# Patient Record
Sex: Female | Born: 1985 | Race: White | Hispanic: No | Marital: Married
Health system: Southern US, Community
[De-identification: ages and names within clinical notes are randomized; demographics above are authoritative.]

## PROBLEM LIST (undated history)

## (undated) DIAGNOSIS — Q631 Lobulated, fused and horseshoe kidney: Secondary | ICD-10-CM

## (undated) DIAGNOSIS — S7400XA Injury of sciatic nerve at hip and thigh level, unspecified leg, initial encounter: Secondary | ICD-10-CM

## (undated) DIAGNOSIS — F3181 Bipolar II disorder: Secondary | ICD-10-CM

## (undated) DIAGNOSIS — M549 Dorsalgia, unspecified: Secondary | ICD-10-CM

## (undated) DIAGNOSIS — G8929 Other chronic pain: Secondary | ICD-10-CM

## (undated) DIAGNOSIS — F410 Panic disorder [episodic paroxysmal anxiety] without agoraphobia: Secondary | ICD-10-CM

## (undated) DIAGNOSIS — J4520 Mild intermittent asthma, uncomplicated: Secondary | ICD-10-CM

## (undated) DIAGNOSIS — N201 Calculus of ureter: Secondary | ICD-10-CM

## (undated) DIAGNOSIS — L309 Dermatitis, unspecified: Secondary | ICD-10-CM

## (undated) DIAGNOSIS — F488 Other specified nonpsychotic mental disorders: Secondary | ICD-10-CM

## (undated) DIAGNOSIS — E538 Deficiency of other specified B group vitamins: Secondary | ICD-10-CM

## (undated) DIAGNOSIS — F319 Bipolar disorder, unspecified: Secondary | ICD-10-CM

## (undated) DIAGNOSIS — F640 Transsexualism: Secondary | ICD-10-CM

## (undated) DIAGNOSIS — K5900 Constipation, unspecified: Secondary | ICD-10-CM

## (undated) DIAGNOSIS — F419 Anxiety disorder, unspecified: Secondary | ICD-10-CM

## (undated) DIAGNOSIS — Z973 Presence of spectacles and contact lenses: Secondary | ICD-10-CM

## (undated) DIAGNOSIS — J45909 Unspecified asthma, uncomplicated: Secondary | ICD-10-CM

## (undated) DIAGNOSIS — F329 Major depressive disorder, single episode, unspecified: Secondary | ICD-10-CM

## (undated) DIAGNOSIS — E559 Vitamin D deficiency, unspecified: Secondary | ICD-10-CM

## (undated) DIAGNOSIS — F32A Depression, unspecified: Secondary | ICD-10-CM

## (undated) DIAGNOSIS — R079 Chest pain, unspecified: Secondary | ICD-10-CM

## (undated) DIAGNOSIS — F4310 Post-traumatic stress disorder, unspecified: Secondary | ICD-10-CM

## (undated) DIAGNOSIS — R350 Frequency of micturition: Secondary | ICD-10-CM

## (undated) DIAGNOSIS — M1990 Unspecified osteoarthritis, unspecified site: Secondary | ICD-10-CM

## (undated) DIAGNOSIS — F411 Generalized anxiety disorder: Secondary | ICD-10-CM

## (undated) HISTORY — DX: Unspecified osteoarthritis, unspecified site: M19.90

## (undated) HISTORY — DX: Transsexualism: F64.0

## (undated) HISTORY — DX: Dorsalgia, unspecified: M54.9

## (undated) HISTORY — DX: Post-traumatic stress disorder, unspecified: F43.10

## (undated) HISTORY — DX: Constipation, unspecified: K59.00

## (undated) HISTORY — DX: Major depressive disorder, single episode, unspecified: F32.9

## (undated) HISTORY — DX: Unspecified asthma, uncomplicated: J45.909

## (undated) HISTORY — DX: Other specified nonpsychotic mental disorders: F48.8

## (undated) HISTORY — DX: Bipolar disorder, unspecified: F31.9

## (undated) HISTORY — DX: Injury of sciatic nerve at hip and thigh level, unspecified leg, initial encounter: S74.00XA

## (undated) HISTORY — PX: BREAST SURGERY: SHX581

## (undated) HISTORY — DX: Chest pain, unspecified: R07.9

## (undated) HISTORY — PX: COSMETIC SURGERY: SHX468

## (undated) HISTORY — DX: Dermatitis, unspecified: L30.9

## (undated) HISTORY — DX: Anxiety disorder, unspecified: F41.9

## (undated) HISTORY — DX: Other chronic pain: G89.29

## (undated) HISTORY — DX: Vitamin D deficiency, unspecified: E55.9

## (undated) HISTORY — DX: Deficiency of other specified B group vitamins: E53.8

## (undated) HISTORY — PX: VAGINA RECONSTRUCTION SURGERY: SHX828

## (undated) HISTORY — DX: Panic disorder (episodic paroxysmal anxiety): F41.0

## (undated) HISTORY — DX: Depression, unspecified: F32.A

---

## 2009-08-14 ENCOUNTER — Emergency Department (HOSPITAL_COMMUNITY): Admission: EM | Admit: 2009-08-14 | Discharge: 2009-08-14 | Payer: Self-pay | Admitting: Emergency Medicine

## 2010-07-16 ENCOUNTER — Emergency Department (HOSPITAL_COMMUNITY)
Admission: EM | Admit: 2010-07-16 | Discharge: 2010-07-16 | Payer: Self-pay | Source: Home / Self Care | Admitting: Emergency Medicine

## 2010-07-29 ENCOUNTER — Emergency Department (HOSPITAL_COMMUNITY)
Admission: EM | Admit: 2010-07-29 | Discharge: 2010-07-29 | Payer: Self-pay | Source: Home / Self Care | Admitting: Emergency Medicine

## 2010-11-23 LAB — URINALYSIS, ROUTINE W REFLEX MICROSCOPIC
Ketones, ur: NEGATIVE mg/dL
Nitrite: NEGATIVE
Protein, ur: NEGATIVE mg/dL

## 2010-11-23 LAB — HEMOCCULT GUIAC POC 1CARD (OFFICE): Fecal Occult Bld: NEGATIVE

## 2010-11-23 LAB — POCT I-STAT, CHEM 8
Calcium, Ion: 1.14 mmol/L (ref 1.12–1.32)
HCT: 41 % (ref 36.0–46.0)
TCO2: 25 mmol/L (ref 0–100)

## 2011-10-17 ENCOUNTER — Encounter: Payer: Self-pay | Admitting: Family Medicine

## 2013-02-16 DIAGNOSIS — K219 Gastro-esophageal reflux disease without esophagitis: Secondary | ICD-10-CM | POA: Insufficient documentation

## 2013-02-16 DIAGNOSIS — J4520 Mild intermittent asthma, uncomplicated: Secondary | ICD-10-CM | POA: Insufficient documentation

## 2013-09-12 DIAGNOSIS — F488 Other specified nonpsychotic mental disorders: Secondary | ICD-10-CM

## 2013-09-12 HISTORY — DX: Other specified nonpsychotic mental disorders: F48.8

## 2015-06-10 LAB — TSH: TSH: 1.57 (ref 0.41–5.90)

## 2015-10-20 ENCOUNTER — Encounter: Payer: Self-pay | Admitting: Family Medicine

## 2015-10-20 LAB — HEMOGLOBIN A1C: Hemoglobin A1C: 5

## 2015-10-20 LAB — LIPID PANEL
Cholesterol: 176 (ref 0–200)
HDL: 63 (ref 35–70)
LDL Cholesterol: 95
Triglycerides: 90 (ref 40–160)

## 2016-03-23 ENCOUNTER — Encounter: Payer: Self-pay | Admitting: Family Medicine

## 2016-03-23 DIAGNOSIS — E281 Androgen excess: Secondary | ICD-10-CM | POA: Diagnosis not present

## 2016-03-23 DIAGNOSIS — K719 Toxic liver disease, unspecified: Secondary | ICD-10-CM | POA: Diagnosis not present

## 2016-03-31 DIAGNOSIS — F4010 Social phobia, unspecified: Secondary | ICD-10-CM | POA: Diagnosis not present

## 2016-03-31 DIAGNOSIS — F4310 Post-traumatic stress disorder, unspecified: Secondary | ICD-10-CM | POA: Diagnosis not present

## 2016-03-31 DIAGNOSIS — F3181 Bipolar II disorder: Secondary | ICD-10-CM | POA: Diagnosis not present

## 2016-03-31 DIAGNOSIS — F411 Generalized anxiety disorder: Secondary | ICD-10-CM | POA: Diagnosis not present

## 2016-04-01 DIAGNOSIS — R197 Diarrhea, unspecified: Secondary | ICD-10-CM | POA: Diagnosis not present

## 2016-04-01 DIAGNOSIS — Z789 Other specified health status: Secondary | ICD-10-CM | POA: Diagnosis not present

## 2016-04-01 DIAGNOSIS — E860 Dehydration: Secondary | ICD-10-CM | POA: Diagnosis not present

## 2016-04-01 DIAGNOSIS — R112 Nausea with vomiting, unspecified: Secondary | ICD-10-CM | POA: Diagnosis not present

## 2016-06-29 DIAGNOSIS — Z6832 Body mass index (BMI) 32.0-32.9, adult: Secondary | ICD-10-CM | POA: Diagnosis not present

## 2016-06-29 DIAGNOSIS — S239XXA Sprain of unspecified parts of thorax, initial encounter: Secondary | ICD-10-CM | POA: Diagnosis not present

## 2016-06-29 DIAGNOSIS — M62830 Muscle spasm of back: Secondary | ICD-10-CM | POA: Diagnosis not present

## 2016-07-28 DIAGNOSIS — F4310 Post-traumatic stress disorder, unspecified: Secondary | ICD-10-CM | POA: Diagnosis not present

## 2016-07-28 DIAGNOSIS — F411 Generalized anxiety disorder: Secondary | ICD-10-CM | POA: Diagnosis not present

## 2016-07-28 DIAGNOSIS — F4010 Social phobia, unspecified: Secondary | ICD-10-CM | POA: Diagnosis not present

## 2016-07-28 DIAGNOSIS — F3181 Bipolar II disorder: Secondary | ICD-10-CM | POA: Diagnosis not present

## 2016-08-23 DIAGNOSIS — F3181 Bipolar II disorder: Secondary | ICD-10-CM | POA: Diagnosis not present

## 2016-08-23 DIAGNOSIS — F4010 Social phobia, unspecified: Secondary | ICD-10-CM | POA: Diagnosis not present

## 2016-08-23 DIAGNOSIS — F4310 Post-traumatic stress disorder, unspecified: Secondary | ICD-10-CM | POA: Diagnosis not present

## 2016-08-23 DIAGNOSIS — F411 Generalized anxiety disorder: Secondary | ICD-10-CM | POA: Diagnosis not present

## 2016-09-22 DIAGNOSIS — E559 Vitamin D deficiency, unspecified: Secondary | ICD-10-CM | POA: Diagnosis not present

## 2016-09-22 DIAGNOSIS — Z6832 Body mass index (BMI) 32.0-32.9, adult: Secondary | ICD-10-CM | POA: Diagnosis not present

## 2016-09-22 DIAGNOSIS — E079 Disorder of thyroid, unspecified: Secondary | ICD-10-CM | POA: Diagnosis not present

## 2016-09-22 DIAGNOSIS — R5383 Other fatigue: Secondary | ICD-10-CM | POA: Diagnosis not present

## 2016-10-10 DIAGNOSIS — R42 Dizziness and giddiness: Secondary | ICD-10-CM | POA: Diagnosis not present

## 2016-10-10 DIAGNOSIS — R509 Fever, unspecified: Secondary | ICD-10-CM | POA: Diagnosis not present

## 2016-10-10 DIAGNOSIS — R0602 Shortness of breath: Secondary | ICD-10-CM | POA: Diagnosis not present

## 2016-10-10 DIAGNOSIS — R197 Diarrhea, unspecified: Secondary | ICD-10-CM | POA: Diagnosis not present

## 2016-10-10 DIAGNOSIS — R5383 Other fatigue: Secondary | ICD-10-CM | POA: Diagnosis not present

## 2016-12-18 DIAGNOSIS — R42 Dizziness and giddiness: Secondary | ICD-10-CM | POA: Diagnosis not present

## 2016-12-18 DIAGNOSIS — Z6832 Body mass index (BMI) 32.0-32.9, adult: Secondary | ICD-10-CM | POA: Diagnosis not present

## 2016-12-28 DIAGNOSIS — F411 Generalized anxiety disorder: Secondary | ICD-10-CM | POA: Diagnosis not present

## 2016-12-28 DIAGNOSIS — F3181 Bipolar II disorder: Secondary | ICD-10-CM | POA: Diagnosis not present

## 2016-12-28 DIAGNOSIS — F4310 Post-traumatic stress disorder, unspecified: Secondary | ICD-10-CM | POA: Diagnosis not present

## 2016-12-28 DIAGNOSIS — F4010 Social phobia, unspecified: Secondary | ICD-10-CM | POA: Diagnosis not present

## 2016-12-31 DIAGNOSIS — R1032 Left lower quadrant pain: Secondary | ICD-10-CM | POA: Diagnosis not present

## 2016-12-31 DIAGNOSIS — J029 Acute pharyngitis, unspecified: Secondary | ICD-10-CM | POA: Diagnosis not present

## 2016-12-31 DIAGNOSIS — K581 Irritable bowel syndrome with constipation: Secondary | ICD-10-CM | POA: Diagnosis not present

## 2016-12-31 DIAGNOSIS — R109 Unspecified abdominal pain: Secondary | ICD-10-CM | POA: Diagnosis not present

## 2016-12-31 DIAGNOSIS — K5901 Slow transit constipation: Secondary | ICD-10-CM | POA: Diagnosis not present

## 2017-01-31 DIAGNOSIS — F4010 Social phobia, unspecified: Secondary | ICD-10-CM | POA: Diagnosis not present

## 2017-01-31 DIAGNOSIS — F3181 Bipolar II disorder: Secondary | ICD-10-CM | POA: Diagnosis not present

## 2017-01-31 DIAGNOSIS — F4310 Post-traumatic stress disorder, unspecified: Secondary | ICD-10-CM | POA: Diagnosis not present

## 2017-01-31 DIAGNOSIS — F411 Generalized anxiety disorder: Secondary | ICD-10-CM | POA: Diagnosis not present

## 2017-03-07 DIAGNOSIS — F4310 Post-traumatic stress disorder, unspecified: Secondary | ICD-10-CM | POA: Diagnosis not present

## 2017-03-07 DIAGNOSIS — F411 Generalized anxiety disorder: Secondary | ICD-10-CM | POA: Diagnosis not present

## 2017-03-07 DIAGNOSIS — F4010 Social phobia, unspecified: Secondary | ICD-10-CM | POA: Diagnosis not present

## 2017-03-07 DIAGNOSIS — F3181 Bipolar II disorder: Secondary | ICD-10-CM | POA: Diagnosis not present

## 2017-03-28 DIAGNOSIS — F4010 Social phobia, unspecified: Secondary | ICD-10-CM | POA: Diagnosis not present

## 2017-03-28 DIAGNOSIS — F410 Panic disorder [episodic paroxysmal anxiety] without agoraphobia: Secondary | ICD-10-CM | POA: Diagnosis not present

## 2017-03-28 DIAGNOSIS — F4310 Post-traumatic stress disorder, unspecified: Secondary | ICD-10-CM | POA: Diagnosis not present

## 2017-03-28 DIAGNOSIS — F3181 Bipolar II disorder: Secondary | ICD-10-CM | POA: Diagnosis not present

## 2017-04-04 DIAGNOSIS — F410 Panic disorder [episodic paroxysmal anxiety] without agoraphobia: Secondary | ICD-10-CM | POA: Diagnosis not present

## 2017-04-04 DIAGNOSIS — F4010 Social phobia, unspecified: Secondary | ICD-10-CM | POA: Diagnosis not present

## 2017-04-04 DIAGNOSIS — F3181 Bipolar II disorder: Secondary | ICD-10-CM | POA: Diagnosis not present

## 2017-04-04 DIAGNOSIS — F4310 Post-traumatic stress disorder, unspecified: Secondary | ICD-10-CM | POA: Diagnosis not present

## 2017-04-11 DIAGNOSIS — F410 Panic disorder [episodic paroxysmal anxiety] without agoraphobia: Secondary | ICD-10-CM | POA: Diagnosis not present

## 2017-04-11 DIAGNOSIS — F4010 Social phobia, unspecified: Secondary | ICD-10-CM | POA: Diagnosis not present

## 2017-04-11 DIAGNOSIS — F3181 Bipolar II disorder: Secondary | ICD-10-CM | POA: Diagnosis not present

## 2017-04-11 DIAGNOSIS — F4310 Post-traumatic stress disorder, unspecified: Secondary | ICD-10-CM | POA: Diagnosis not present

## 2017-04-18 DIAGNOSIS — F4310 Post-traumatic stress disorder, unspecified: Secondary | ICD-10-CM | POA: Diagnosis not present

## 2017-04-18 DIAGNOSIS — F3181 Bipolar II disorder: Secondary | ICD-10-CM | POA: Diagnosis not present

## 2017-04-18 DIAGNOSIS — F4010 Social phobia, unspecified: Secondary | ICD-10-CM | POA: Diagnosis not present

## 2017-04-18 DIAGNOSIS — F410 Panic disorder [episodic paroxysmal anxiety] without agoraphobia: Secondary | ICD-10-CM | POA: Diagnosis not present

## 2017-04-25 DIAGNOSIS — F4310 Post-traumatic stress disorder, unspecified: Secondary | ICD-10-CM | POA: Diagnosis not present

## 2017-04-25 DIAGNOSIS — F4010 Social phobia, unspecified: Secondary | ICD-10-CM | POA: Diagnosis not present

## 2017-04-25 DIAGNOSIS — F3181 Bipolar II disorder: Secondary | ICD-10-CM | POA: Diagnosis not present

## 2017-04-25 DIAGNOSIS — F410 Panic disorder [episodic paroxysmal anxiety] without agoraphobia: Secondary | ICD-10-CM | POA: Diagnosis not present

## 2017-04-26 DIAGNOSIS — R635 Abnormal weight gain: Secondary | ICD-10-CM | POA: Diagnosis not present

## 2017-04-26 DIAGNOSIS — E281 Androgen excess: Secondary | ICD-10-CM | POA: Diagnosis not present

## 2017-04-27 ENCOUNTER — Encounter: Payer: Self-pay | Admitting: Family Medicine

## 2017-04-27 DIAGNOSIS — K719 Toxic liver disease, unspecified: Secondary | ICD-10-CM | POA: Diagnosis not present

## 2017-04-27 DIAGNOSIS — E281 Androgen excess: Secondary | ICD-10-CM | POA: Diagnosis not present

## 2017-05-01 DIAGNOSIS — F4310 Post-traumatic stress disorder, unspecified: Secondary | ICD-10-CM | POA: Diagnosis not present

## 2017-05-01 DIAGNOSIS — F4010 Social phobia, unspecified: Secondary | ICD-10-CM | POA: Diagnosis not present

## 2017-05-01 DIAGNOSIS — F410 Panic disorder [episodic paroxysmal anxiety] without agoraphobia: Secondary | ICD-10-CM | POA: Diagnosis not present

## 2017-05-01 DIAGNOSIS — F3181 Bipolar II disorder: Secondary | ICD-10-CM | POA: Diagnosis not present

## 2017-05-06 DIAGNOSIS — S93401A Sprain of unspecified ligament of right ankle, initial encounter: Secondary | ICD-10-CM | POA: Diagnosis not present

## 2017-05-08 DIAGNOSIS — F3181 Bipolar II disorder: Secondary | ICD-10-CM | POA: Diagnosis not present

## 2017-05-08 DIAGNOSIS — F410 Panic disorder [episodic paroxysmal anxiety] without agoraphobia: Secondary | ICD-10-CM | POA: Diagnosis not present

## 2017-05-08 DIAGNOSIS — F4310 Post-traumatic stress disorder, unspecified: Secondary | ICD-10-CM | POA: Diagnosis not present

## 2017-05-08 DIAGNOSIS — F4010 Social phobia, unspecified: Secondary | ICD-10-CM | POA: Diagnosis not present

## 2017-05-16 DIAGNOSIS — F410 Panic disorder [episodic paroxysmal anxiety] without agoraphobia: Secondary | ICD-10-CM | POA: Diagnosis not present

## 2017-05-16 DIAGNOSIS — F3181 Bipolar II disorder: Secondary | ICD-10-CM | POA: Diagnosis not present

## 2017-05-16 DIAGNOSIS — F4310 Post-traumatic stress disorder, unspecified: Secondary | ICD-10-CM | POA: Diagnosis not present

## 2017-05-16 DIAGNOSIS — F4010 Social phobia, unspecified: Secondary | ICD-10-CM | POA: Diagnosis not present

## 2017-05-23 DIAGNOSIS — F4310 Post-traumatic stress disorder, unspecified: Secondary | ICD-10-CM | POA: Diagnosis not present

## 2017-05-23 DIAGNOSIS — F4010 Social phobia, unspecified: Secondary | ICD-10-CM | POA: Diagnosis not present

## 2017-05-23 DIAGNOSIS — F3181 Bipolar II disorder: Secondary | ICD-10-CM | POA: Diagnosis not present

## 2017-05-23 DIAGNOSIS — F410 Panic disorder [episodic paroxysmal anxiety] without agoraphobia: Secondary | ICD-10-CM | POA: Diagnosis not present

## 2017-05-29 DIAGNOSIS — F4310 Post-traumatic stress disorder, unspecified: Secondary | ICD-10-CM | POA: Diagnosis not present

## 2017-05-29 DIAGNOSIS — F410 Panic disorder [episodic paroxysmal anxiety] without agoraphobia: Secondary | ICD-10-CM | POA: Diagnosis not present

## 2017-05-29 DIAGNOSIS — F3181 Bipolar II disorder: Secondary | ICD-10-CM | POA: Diagnosis not present

## 2017-05-29 DIAGNOSIS — F4010 Social phobia, unspecified: Secondary | ICD-10-CM | POA: Diagnosis not present

## 2017-06-05 DIAGNOSIS — F4010 Social phobia, unspecified: Secondary | ICD-10-CM | POA: Diagnosis not present

## 2017-06-05 DIAGNOSIS — F4310 Post-traumatic stress disorder, unspecified: Secondary | ICD-10-CM | POA: Diagnosis not present

## 2017-06-05 DIAGNOSIS — F410 Panic disorder [episodic paroxysmal anxiety] without agoraphobia: Secondary | ICD-10-CM | POA: Diagnosis not present

## 2017-06-05 DIAGNOSIS — F3181 Bipolar II disorder: Secondary | ICD-10-CM | POA: Diagnosis not present

## 2017-06-12 DIAGNOSIS — F4310 Post-traumatic stress disorder, unspecified: Secondary | ICD-10-CM | POA: Diagnosis not present

## 2017-06-12 DIAGNOSIS — F4010 Social phobia, unspecified: Secondary | ICD-10-CM | POA: Diagnosis not present

## 2017-06-12 DIAGNOSIS — F410 Panic disorder [episodic paroxysmal anxiety] without agoraphobia: Secondary | ICD-10-CM | POA: Diagnosis not present

## 2017-06-12 DIAGNOSIS — F3181 Bipolar II disorder: Secondary | ICD-10-CM | POA: Diagnosis not present

## 2017-06-13 DIAGNOSIS — F3181 Bipolar II disorder: Secondary | ICD-10-CM | POA: Diagnosis not present

## 2017-06-13 DIAGNOSIS — F4310 Post-traumatic stress disorder, unspecified: Secondary | ICD-10-CM | POA: Diagnosis not present

## 2017-06-13 DIAGNOSIS — F410 Panic disorder [episodic paroxysmal anxiety] without agoraphobia: Secondary | ICD-10-CM | POA: Diagnosis not present

## 2017-06-13 DIAGNOSIS — F4010 Social phobia, unspecified: Secondary | ICD-10-CM | POA: Diagnosis not present

## 2017-06-14 DIAGNOSIS — R0789 Other chest pain: Secondary | ICD-10-CM | POA: Diagnosis not present

## 2017-06-14 DIAGNOSIS — R079 Chest pain, unspecified: Secondary | ICD-10-CM | POA: Diagnosis not present

## 2017-06-14 DIAGNOSIS — R1011 Right upper quadrant pain: Secondary | ICD-10-CM | POA: Diagnosis not present

## 2017-06-19 DIAGNOSIS — F3181 Bipolar II disorder: Secondary | ICD-10-CM | POA: Diagnosis not present

## 2017-06-19 DIAGNOSIS — F410 Panic disorder [episodic paroxysmal anxiety] without agoraphobia: Secondary | ICD-10-CM | POA: Diagnosis not present

## 2017-06-19 DIAGNOSIS — F4010 Social phobia, unspecified: Secondary | ICD-10-CM | POA: Diagnosis not present

## 2017-06-19 DIAGNOSIS — F4310 Post-traumatic stress disorder, unspecified: Secondary | ICD-10-CM | POA: Diagnosis not present

## 2017-06-26 DIAGNOSIS — F4010 Social phobia, unspecified: Secondary | ICD-10-CM | POA: Diagnosis not present

## 2017-06-26 DIAGNOSIS — F3181 Bipolar II disorder: Secondary | ICD-10-CM | POA: Diagnosis not present

## 2017-06-26 DIAGNOSIS — F4310 Post-traumatic stress disorder, unspecified: Secondary | ICD-10-CM | POA: Diagnosis not present

## 2017-06-26 DIAGNOSIS — R635 Abnormal weight gain: Secondary | ICD-10-CM | POA: Diagnosis not present

## 2017-06-26 DIAGNOSIS — F410 Panic disorder [episodic paroxysmal anxiety] without agoraphobia: Secondary | ICD-10-CM | POA: Diagnosis not present

## 2017-07-04 DIAGNOSIS — F4310 Post-traumatic stress disorder, unspecified: Secondary | ICD-10-CM | POA: Diagnosis not present

## 2017-07-04 DIAGNOSIS — F3181 Bipolar II disorder: Secondary | ICD-10-CM | POA: Diagnosis not present

## 2017-07-04 DIAGNOSIS — F410 Panic disorder [episodic paroxysmal anxiety] without agoraphobia: Secondary | ICD-10-CM | POA: Diagnosis not present

## 2017-07-04 DIAGNOSIS — F4010 Social phobia, unspecified: Secondary | ICD-10-CM | POA: Diagnosis not present

## 2017-07-14 DIAGNOSIS — F410 Panic disorder [episodic paroxysmal anxiety] without agoraphobia: Secondary | ICD-10-CM | POA: Diagnosis not present

## 2017-07-14 DIAGNOSIS — F3181 Bipolar II disorder: Secondary | ICD-10-CM | POA: Diagnosis not present

## 2017-07-14 DIAGNOSIS — F4010 Social phobia, unspecified: Secondary | ICD-10-CM | POA: Diagnosis not present

## 2017-07-14 DIAGNOSIS — F4310 Post-traumatic stress disorder, unspecified: Secondary | ICD-10-CM | POA: Diagnosis not present

## 2017-07-17 DIAGNOSIS — F4010 Social phobia, unspecified: Secondary | ICD-10-CM | POA: Diagnosis not present

## 2017-07-17 DIAGNOSIS — F410 Panic disorder [episodic paroxysmal anxiety] without agoraphobia: Secondary | ICD-10-CM | POA: Diagnosis not present

## 2017-07-17 DIAGNOSIS — F4310 Post-traumatic stress disorder, unspecified: Secondary | ICD-10-CM | POA: Diagnosis not present

## 2017-07-17 DIAGNOSIS — F3181 Bipolar II disorder: Secondary | ICD-10-CM | POA: Diagnosis not present

## 2017-07-24 DIAGNOSIS — F4010 Social phobia, unspecified: Secondary | ICD-10-CM | POA: Diagnosis not present

## 2017-07-24 DIAGNOSIS — F4310 Post-traumatic stress disorder, unspecified: Secondary | ICD-10-CM | POA: Diagnosis not present

## 2017-07-24 DIAGNOSIS — F3181 Bipolar II disorder: Secondary | ICD-10-CM | POA: Diagnosis not present

## 2017-07-24 DIAGNOSIS — F410 Panic disorder [episodic paroxysmal anxiety] without agoraphobia: Secondary | ICD-10-CM | POA: Diagnosis not present

## 2017-07-31 ENCOUNTER — Ambulatory Visit (HOSPITAL_COMMUNITY)
Admission: RE | Admit: 2017-07-31 | Discharge: 2017-07-31 | Disposition: A | Discharge status: Home / Self Care | Payer: BLUE CROSS/BLUE SHIELD | Attending: Psychiatry | Admitting: Psychiatry

## 2017-07-31 DIAGNOSIS — F329 Major depressive disorder, single episode, unspecified: Secondary | ICD-10-CM | POA: Insufficient documentation

## 2017-07-31 DIAGNOSIS — R45851 Suicidal ideations: Secondary | ICD-10-CM | POA: Diagnosis not present
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
## 2017-08-01 DIAGNOSIS — F3181 Bipolar II disorder: Secondary | ICD-10-CM | POA: Diagnosis not present

## 2017-08-01 DIAGNOSIS — F4310 Post-traumatic stress disorder, unspecified: Secondary | ICD-10-CM | POA: Diagnosis not present

## 2017-08-01 DIAGNOSIS — F410 Panic disorder [episodic paroxysmal anxiety] without agoraphobia: Secondary | ICD-10-CM | POA: Diagnosis not present

## 2017-08-01 DIAGNOSIS — F4010 Social phobia, unspecified: Secondary | ICD-10-CM | POA: Diagnosis not present

## 2017-08-01 NOTE — H&P (Signed)
Behavioral Health Medical Screening Exam  Janet Tucker is an 31 y.o. female, is a 31 y/o transgender female to female, [facility] and accompanied with her husband, with reported suicide attempt/gesture with attempted O/D of prescribed Klonopin. She is endorsing both anxiety and depressive symptoms. She is scheduled to see her out-patient medication mgmt prescribing provider tomorrow and saw her therapist recently. She is denying SI.SA or HI at this time.  Total Time spent with patient: 15 minutes  Psychiatric Specialty Exam: Physical Exam  Constitutional: She is oriented to person, place, and time. She appears well-developed and well-nourished. No distress.  HENT:  Head: Normocephalic.  Eyes: Pupils are equal, round, and reactive to light.  Neurological: She is alert and oriented to person, place, and time. No cranial nerve deficit.  Skin: Skin is warm and dry. She is not diaphoretic.  Psychiatric: Her speech is normal. Thought content normal. She is withdrawn. Cognition and memory are normal. She expresses impulsivity. She exhibits a depressed mood.    Review of Systems  Psychiatric/Behavioral: Positive for depression and suicidal ideas. Negative for hallucinations and substance abuse.  All other systems reviewed and are negative.   There were no vitals taken for this visit.There is no height or weight on file to calculate BMI.  General Appearance: Casual  Eye Contact:  Good  Speech:  Clear and Coherent  Volume:  Normal  Mood:  Depressed  Affect:  Congruent  Thought Process:  Coherent  Orientation:  Full (Time, Place, and Person)  Thought Content:  Logical  Suicidal Thoughts:  No  Homicidal Thoughts:  No  Memory:  Immediate;   Good  Judgement:  Fair  Insight:  Fair  Psychomotor Activity:  Normal  Concentration: Concentration: Good  Recall:  Good  Fund of Knowledge:Good  Language: Good  Akathisia:  No  Handed:  Right  AIMS (if indicated):     Assets:   Desire for Improvement  Sleep:       Musculoskeletal: Strength & Muscle Tone: within normal limits Gait & Station: normal Patient leans: N/A  There were no vitals taken for this visit.  Recommendations:  Based on my evaluation the patient does not appear to have an emergency medical condition.  Kerry HoughSpencer E Finnleigh Marchetti, PA-C 08/01/2017, 2:49 AM

## 2017-08-08 DIAGNOSIS — F4310 Post-traumatic stress disorder, unspecified: Secondary | ICD-10-CM | POA: Diagnosis not present

## 2017-08-08 DIAGNOSIS — F410 Panic disorder [episodic paroxysmal anxiety] without agoraphobia: Secondary | ICD-10-CM | POA: Diagnosis not present

## 2017-08-08 DIAGNOSIS — F4010 Social phobia, unspecified: Secondary | ICD-10-CM | POA: Diagnosis not present

## 2017-08-08 DIAGNOSIS — F3181 Bipolar II disorder: Secondary | ICD-10-CM | POA: Diagnosis not present

## 2017-08-10 DIAGNOSIS — J209 Acute bronchitis, unspecified: Secondary | ICD-10-CM | POA: Diagnosis not present

## 2017-08-14 DIAGNOSIS — F3181 Bipolar II disorder: Secondary | ICD-10-CM | POA: Diagnosis not present

## 2017-08-14 DIAGNOSIS — F410 Panic disorder [episodic paroxysmal anxiety] without agoraphobia: Secondary | ICD-10-CM | POA: Diagnosis not present

## 2017-08-14 DIAGNOSIS — F4010 Social phobia, unspecified: Secondary | ICD-10-CM | POA: Diagnosis not present

## 2017-08-14 DIAGNOSIS — F4310 Post-traumatic stress disorder, unspecified: Secondary | ICD-10-CM | POA: Diagnosis not present

## 2017-08-14 DIAGNOSIS — F641 Gender identity disorder in adolescence and adulthood: Secondary | ICD-10-CM | POA: Diagnosis not present

## 2017-08-16 DIAGNOSIS — F3181 Bipolar II disorder: Secondary | ICD-10-CM | POA: Diagnosis not present

## 2017-08-16 DIAGNOSIS — F4010 Social phobia, unspecified: Secondary | ICD-10-CM | POA: Diagnosis not present

## 2017-08-16 DIAGNOSIS — F410 Panic disorder [episodic paroxysmal anxiety] without agoraphobia: Secondary | ICD-10-CM | POA: Diagnosis not present

## 2017-08-16 DIAGNOSIS — F4310 Post-traumatic stress disorder, unspecified: Secondary | ICD-10-CM | POA: Diagnosis not present

## 2017-08-22 DIAGNOSIS — F4010 Social phobia, unspecified: Secondary | ICD-10-CM | POA: Diagnosis not present

## 2017-08-22 DIAGNOSIS — F4310 Post-traumatic stress disorder, unspecified: Secondary | ICD-10-CM | POA: Diagnosis not present

## 2017-08-22 DIAGNOSIS — F3181 Bipolar II disorder: Secondary | ICD-10-CM | POA: Diagnosis not present

## 2017-08-22 DIAGNOSIS — F410 Panic disorder [episodic paroxysmal anxiety] without agoraphobia: Secondary | ICD-10-CM | POA: Diagnosis not present

## 2017-08-28 DIAGNOSIS — F410 Panic disorder [episodic paroxysmal anxiety] without agoraphobia: Secondary | ICD-10-CM | POA: Diagnosis not present

## 2017-08-28 DIAGNOSIS — F3181 Bipolar II disorder: Secondary | ICD-10-CM | POA: Diagnosis not present

## 2017-08-28 DIAGNOSIS — R635 Abnormal weight gain: Secondary | ICD-10-CM | POA: Diagnosis not present

## 2017-08-28 DIAGNOSIS — F4310 Post-traumatic stress disorder, unspecified: Secondary | ICD-10-CM | POA: Diagnosis not present

## 2017-08-28 DIAGNOSIS — F4010 Social phobia, unspecified: Secondary | ICD-10-CM | POA: Diagnosis not present

## 2017-08-29 DIAGNOSIS — F4310 Post-traumatic stress disorder, unspecified: Secondary | ICD-10-CM | POA: Diagnosis not present

## 2017-08-29 DIAGNOSIS — F4010 Social phobia, unspecified: Secondary | ICD-10-CM | POA: Diagnosis not present

## 2017-08-29 DIAGNOSIS — F3181 Bipolar II disorder: Secondary | ICD-10-CM | POA: Diagnosis not present

## 2017-08-29 DIAGNOSIS — F410 Panic disorder [episodic paroxysmal anxiety] without agoraphobia: Secondary | ICD-10-CM | POA: Diagnosis not present

## 2017-08-30 DIAGNOSIS — F410 Panic disorder [episodic paroxysmal anxiety] without agoraphobia: Secondary | ICD-10-CM | POA: Diagnosis not present

## 2017-08-30 DIAGNOSIS — F4310 Post-traumatic stress disorder, unspecified: Secondary | ICD-10-CM | POA: Diagnosis not present

## 2017-08-30 DIAGNOSIS — F4010 Social phobia, unspecified: Secondary | ICD-10-CM | POA: Diagnosis not present

## 2017-08-30 DIAGNOSIS — F3181 Bipolar II disorder: Secondary | ICD-10-CM | POA: Diagnosis not present

## 2017-09-12 DIAGNOSIS — F4310 Post-traumatic stress disorder, unspecified: Secondary | ICD-10-CM | POA: Diagnosis not present

## 2017-09-12 DIAGNOSIS — F410 Panic disorder [episodic paroxysmal anxiety] without agoraphobia: Secondary | ICD-10-CM | POA: Diagnosis not present

## 2017-09-12 DIAGNOSIS — F3181 Bipolar II disorder: Secondary | ICD-10-CM | POA: Diagnosis not present

## 2017-09-12 DIAGNOSIS — F4010 Social phobia, unspecified: Secondary | ICD-10-CM | POA: Diagnosis not present

## 2017-09-12 HISTORY — PX: OTHER SURGICAL HISTORY: SHX169

## 2017-09-19 DIAGNOSIS — F3181 Bipolar II disorder: Secondary | ICD-10-CM | POA: Diagnosis not present

## 2017-09-19 DIAGNOSIS — F4010 Social phobia, unspecified: Secondary | ICD-10-CM | POA: Diagnosis not present

## 2017-09-19 DIAGNOSIS — F410 Panic disorder [episodic paroxysmal anxiety] without agoraphobia: Secondary | ICD-10-CM | POA: Diagnosis not present

## 2017-09-19 DIAGNOSIS — F4310 Post-traumatic stress disorder, unspecified: Secondary | ICD-10-CM | POA: Diagnosis not present

## 2017-09-26 DIAGNOSIS — F4310 Post-traumatic stress disorder, unspecified: Secondary | ICD-10-CM | POA: Diagnosis not present

## 2017-09-26 DIAGNOSIS — F410 Panic disorder [episodic paroxysmal anxiety] without agoraphobia: Secondary | ICD-10-CM | POA: Diagnosis not present

## 2017-09-26 DIAGNOSIS — F3181 Bipolar II disorder: Secondary | ICD-10-CM | POA: Diagnosis not present

## 2017-09-26 DIAGNOSIS — F4010 Social phobia, unspecified: Secondary | ICD-10-CM | POA: Diagnosis not present

## 2017-10-03 DIAGNOSIS — F4010 Social phobia, unspecified: Secondary | ICD-10-CM | POA: Diagnosis not present

## 2017-10-03 DIAGNOSIS — F4310 Post-traumatic stress disorder, unspecified: Secondary | ICD-10-CM | POA: Diagnosis not present

## 2017-10-03 DIAGNOSIS — F3181 Bipolar II disorder: Secondary | ICD-10-CM | POA: Diagnosis not present

## 2017-10-03 DIAGNOSIS — F410 Panic disorder [episodic paroxysmal anxiety] without agoraphobia: Secondary | ICD-10-CM | POA: Diagnosis not present

## 2017-10-09 DIAGNOSIS — F4310 Post-traumatic stress disorder, unspecified: Secondary | ICD-10-CM | POA: Diagnosis not present

## 2017-10-09 DIAGNOSIS — F4010 Social phobia, unspecified: Secondary | ICD-10-CM | POA: Diagnosis not present

## 2017-10-09 DIAGNOSIS — F3181 Bipolar II disorder: Secondary | ICD-10-CM | POA: Diagnosis not present

## 2017-10-09 DIAGNOSIS — F410 Panic disorder [episodic paroxysmal anxiety] without agoraphobia: Secondary | ICD-10-CM | POA: Diagnosis not present

## 2017-10-17 DIAGNOSIS — F4310 Post-traumatic stress disorder, unspecified: Secondary | ICD-10-CM | POA: Diagnosis not present

## 2017-10-17 DIAGNOSIS — F4010 Social phobia, unspecified: Secondary | ICD-10-CM | POA: Diagnosis not present

## 2017-10-17 DIAGNOSIS — F410 Panic disorder [episodic paroxysmal anxiety] without agoraphobia: Secondary | ICD-10-CM | POA: Diagnosis not present

## 2017-10-17 DIAGNOSIS — F3181 Bipolar II disorder: Secondary | ICD-10-CM | POA: Diagnosis not present

## 2017-10-24 DIAGNOSIS — F410 Panic disorder [episodic paroxysmal anxiety] without agoraphobia: Secondary | ICD-10-CM | POA: Diagnosis not present

## 2017-10-24 DIAGNOSIS — F4010 Social phobia, unspecified: Secondary | ICD-10-CM | POA: Diagnosis not present

## 2017-10-24 DIAGNOSIS — F4310 Post-traumatic stress disorder, unspecified: Secondary | ICD-10-CM | POA: Diagnosis not present

## 2017-10-24 DIAGNOSIS — F3181 Bipolar II disorder: Secondary | ICD-10-CM | POA: Diagnosis not present

## 2017-10-31 DIAGNOSIS — F410 Panic disorder [episodic paroxysmal anxiety] without agoraphobia: Secondary | ICD-10-CM | POA: Diagnosis not present

## 2017-10-31 DIAGNOSIS — F3181 Bipolar II disorder: Secondary | ICD-10-CM | POA: Diagnosis not present

## 2017-10-31 DIAGNOSIS — F4010 Social phobia, unspecified: Secondary | ICD-10-CM | POA: Diagnosis not present

## 2017-10-31 DIAGNOSIS — F4310 Post-traumatic stress disorder, unspecified: Secondary | ICD-10-CM | POA: Diagnosis not present

## 2017-11-02 ENCOUNTER — Encounter: Payer: Self-pay | Admitting: Family Medicine

## 2017-11-02 ENCOUNTER — Ambulatory Visit: Payer: BLUE CROSS/BLUE SHIELD | Admitting: Family Medicine

## 2017-11-02 ENCOUNTER — Other Ambulatory Visit: Payer: Self-pay

## 2017-11-02 VITALS — BP 107/73 | HR 86 | Temp 98.6°F | Resp 16 | Ht 65.0 in | Wt 187.4 lb

## 2017-11-02 DIAGNOSIS — F39 Unspecified mood [affective] disorder: Secondary | ICD-10-CM | POA: Diagnosis not present

## 2017-11-02 DIAGNOSIS — Z789 Other specified health status: Secondary | ICD-10-CM

## 2017-11-02 DIAGNOSIS — Z5181 Encounter for therapeutic drug level monitoring: Secondary | ICD-10-CM | POA: Diagnosis not present

## 2017-11-02 DIAGNOSIS — E349 Endocrine disorder, unspecified: Secondary | ICD-10-CM

## 2017-11-02 DIAGNOSIS — F640 Transsexualism: Secondary | ICD-10-CM | POA: Diagnosis not present

## 2017-11-02 MED ORDER — PHENTERMINE HCL 37.5 MG PO CAPS
37.5000 mg | ORAL_CAPSULE | ORAL | 5 refills | Status: DC
Start: 2017-11-02 — End: 2017-12-28

## 2017-11-02 MED ORDER — ESTRADIOL 2 MG PO TABS: 2.0000 mg | ORAL_TABLET | Freq: Two times a day (BID) | ORAL | 1 refills | Status: DC

## 2017-11-02 MED ORDER — LITHIUM CARBONATE ER 300 MG PO TBCR: 600.0000 mg | EXTENDED_RELEASE_TABLET | Freq: Every day | ORAL | 0 refills | Status: DC

## 2017-11-02 NOTE — Progress Notes (Signed)
Subjective:  By signing my name below, I, Essence Howell, attest that this documentation has been prepared under the direction and in the presence of Norberto Sorenson, MD Electronically Signed: Charline Bills, ED Scribe 11/02/2017 at 10:58 AM.   Patient ID: Janet Tucker, female    DOB: 07-03-1986, 32 y.o.   MRN: [id number]  Chief Complaint  Patient presents with  . Establish Care  . Medication Refill    phentermine   HPI Janet Tucker is a 32 y.o. female transgender female to female who presents to Primary Care at [facility] to establish care.Pt is fasting at this visit.  Pt was previously seen by Dr. Bobbie Stack, retired, who was prescribing hormone therapy. However, she started hormone therapy ~10 yrs ago via the internet. She has an upcoming surgery in [place] in Aug by Dr. Julien Girt. Pt requests a refill of Phentermine which she restarted in Dec. She is currently taking 1 tab in the morning and 1/2 tab around 2 or 3 PM. States she has to get to 180 lbs for her surgery in Aug. States she was off it for 1 yr prior. Highest weight was 212 within the past 2 yrs; went up to 198 when she stopped meds but was on depo as well with her last shot being in Aug. Has noticed mild hand tremors which she attributes to possibly a combination of phentermine and lithium but denies palpitations.   Married to female.   3 months prior to pt walked into behavioral health after suicide attempt/gesture if attempted OD of klonopin. Depression and anxiety symptoms. Therapist Tresa Endo at [facility] and psychiatrist Fransisca Kaufmann, NP she is working with closely. They declined in patient hospitalization, recommended she f/u with providers the following day.   Past Medical History:  Diagnosis Date  . Anxiety   . Arthritis   . Asthma   . Depression    Current Outpatient Medications on File Prior to Visit  Medication Sig Dispense Refill  . ARIPiprazole (ABILIFY) 5 MG tablet Take 5 mg by mouth daily.    Marland Kitchen estradiol  (ESTRACE) 2 MG tablet Take 2 mg by mouth 2 (two) times daily.    Marland Kitchen FLUoxetine (PROZAC) 20 MG capsule Take 20 mg by mouth daily.    Marland Kitchen gabapentin (NEURONTIN) 600 MG tablet Take 600 mg by mouth 2 (two) times daily.    Marland Kitchen lamoTRIgine (LAMICTAL) 100 MG tablet Take 100 mg by mouth daily.    Marland Kitchen lithium 300 MG tablet Take 300 mg by mouth at bedtime.    . phentermine 37.5 MG capsule Take 37.5 mg by mouth every morning.    Marland Kitchen spironolactone (ALDACTONE) 100 MG tablet Take 100 mg by mouth 2 (two) times daily.    Marland Kitchen zolpidem (AMBIEN) 5 MG tablet Take 5 mg by mouth at bedtime.     No current facility-administered medications on file prior to visit.    No Known Allergies   Past Surgical History:  Procedure Laterality Date  . BREAST SURGERY     implants   Family History  Problem Relation Age of Onset  . Diabetes Mother   . Diabetes Father   . Cancer Maternal Grandmother        pancreas  . Mental illness Paternal Grandmother   . Cancer Paternal Grandfather   . Heart disease Paternal Grandfather    Social History   Socioeconomic History  . Marital status: Married    Spouse name: None  . Number of children: None  . Years  of education: None  . Highest education level: None  Social Needs  . Financial resource strain: None  . Food insecurity - worry: None  . Food insecurity - inability: None  . Transportation needs - medical: None  . Transportation needs - non-medical: None  Occupational History  . None  Tobacco Use  . Smoking status: Never Smoker  . Smokeless tobacco: Never Used  Substance and Sexual Activity  . Alcohol use: Yes    Alcohol/week: 0.6 oz    Types: 1 Shots of liquor per week    Comment: occasionally  . Drug use: No  . Sexual activity: None  Other Topics Concern  . None  Social History Narrative  . None   Depression screen [facility] 2/9 11/02/2017  Decreased Interest 0  Down, Depressed, Hopeless 0  PHQ - 2 Score 0    Review of Systems  Cardiovascular: Negative for  palpitations.  Neurological: Positive for tremors (mild).      Objective:   Physical Exam  Constitutional: She is oriented to person, place, and time. She appears well-developed and well-nourished. No distress.  HENT:  Head: Normocephalic and atraumatic.  Eyes: Conjunctivae and EOM are normal.  Neck: Neck supple. No tracheal deviation present. No thyromegaly present.  Cardiovascular: Normal rate, regular rhythm and normal heart sounds.  Pulmonary/Chest: Effort normal and breath sounds normal. No respiratory distress.  Musculoskeletal: Normal range of motion.  Neurological: She is alert and oriented to person, place, and time.  Skin: Skin is warm and dry.  Psychiatric: She has a normal mood and affect. Her behavior is normal.  Nursing note and vitals reviewed.  BP 107/73 (BP Location: Right Arm, Patient Position: Sitting, Cuff Size: Large)   Pulse 86   Temp 98.6 F (37 C) (Oral)   Resp 16   Ht 5\' 5"  (1.651 m)   Wt 187 lb 6.4 oz (85 kg)   SpO2 100%   BMI 31.18 kg/m     Assessment & Plan:  Check UA and EKG at f/u.  1. Endocrine disorder   2. Medication monitoring encounter   3. Female-to-female transgender person   4. Mood disorder (HCC) - refilled lithium x 1 mo to bridge pt until she can get in contact w/ her presc[facility] - [name]    Orders Placed This Encounter  Procedures  . Lithium level  . CBC with Differential/Platelet  . Comprehensive metabolic panel    Order Specific Question:   Has the patient fasted?    Answer:   Yes  . Lipid panel    Order Specific Question:   Has the patient fasted?    Answer:   Yes  . TSH  . TestT+TestF+SHBG  . Estradiol  . FSH/LH    Meds ordered this encounter  Medications  . lithium carbonate (LITHOBID) 300 MG CR tablet    Sig: Take 2 tablets (600 mg total) by mouth at bedtime.    Dispense:  60 tablet    Refill:  0    Future refills will come from [name], NP  . estradiol (ESTRACE) 2 MG  tablet    Sig: Take 1 tablet (2 mg total) by mouth 2 (two) times daily.    Dispense:  180 tablet    Refill:  1  . phentermine 37.5 MG capsule    Sig: Take 1 capsule (37.5 mg total) by mouth every morning. And 1/2 tablet by mouth every afternoon.    Dispense:  45 capsule  Refill:  5    Do not fill more frequently than every 28 days    I personally performed the services described in this documentation, which was scribed in my presence. The recorded information has been reviewed and considered, and addended by me as needed.   Norberto Sorenson, M.D.  Primary Care at [facility] [address] [place], [place] [address] [phone] phone [phone] fax  11/04/17 1:41 AM

## 2017-11-02 NOTE — Patient Instructions (Addendum)
Sign a release so we can get your prior records from Dr. [place] office and share records (labs) with Ms. Davis at the [facility].    IF you received an x-ray today, you will receive an invoice from [facility]. Please contact [facility] at [phone] with questions or concerns regarding your invoice.   IF you received labwork today, you will receive an invoice from [facility]. Please contact [facility] at [phone] with questions or concerns regarding your invoice.   Our billing staff will not be able to assist you with questions regarding bills from these companies.  You will be contacted with the lab results as soon as they are available. The fastest way to get your results is to activate your My Chart account. Instructions are located on the last page of this paperwork. If you have not heard from us regarding the results in 2 weeks, please contact this office.

## 2017-11-04 ENCOUNTER — Encounter: Payer: Self-pay | Admitting: Family Medicine

## 2017-11-05 LAB — FSH/LH
FSH: 0.8 m[IU]/mL
LH: 2.9 m[IU]/mL

## 2017-11-05 LAB — COMPREHENSIVE METABOLIC PANEL
ALBUMIN: 4.6 g/dL (ref 3.5–5.5)
ALT: 14 IU/L (ref 0–32)
AST: 13 IU/L (ref 0–40)
Albumin/Globulin Ratio: 1.7 (ref 1.2–2.2)
Alkaline Phosphatase: 48 IU/L (ref 39–117)
BUN / CREAT RATIO: 11 (ref 9–23)
BUN: 11 mg/dL (ref 6–20)
CO2: 22 mmol/L (ref 20–29)
CREATININE: 1.01 mg/dL — AB (ref 0.57–1.00)
Calcium: 9.8 mg/dL (ref 8.7–10.2)
Chloride: 101 mmol/L (ref 96–106)
GFR calc Af Amer: 86 mL/min/{1.73_m2} (ref >59)
GFR calc non Af Amer: 75 mL/min/{1.73_m2} (ref >59)
GLOBULIN, TOTAL: 2.7 g/dL (ref 1.5–4.5)
GLUCOSE: 95 mg/dL (ref 65–99)
POTASSIUM: 4.9 mmol/L (ref 3.5–5.2)
Sodium: 135 mmol/L (ref 134–144)
Total Protein: 7.3 g/dL (ref 6.0–8.5)

## 2017-11-05 LAB — CBC WITH DIFFERENTIAL/PLATELET
BASOS ABS: 0 10*3/uL (ref 0.0–0.2)
Basos: 0 %
EOS (ABSOLUTE): 0.5 10*3/uL — ABNORMAL HIGH (ref 0.0–0.4)
Eos: 5 %
Hematocrit: 42.2 % (ref 34.0–46.6)
Hemoglobin: 14 g/dL (ref 11.1–15.9)
Immature Grans (Abs): 0 10*3/uL (ref 0.0–0.1)
Immature Granulocytes: 0 %
LYMPHS ABS: 2.2 10*3/uL (ref 0.7–3.1)
Lymphs: 24 %
MCH: 29 pg (ref 26.6–33.0)
MCHC: 33.2 g/dL (ref 31.5–35.7)
MCV: 88 fL (ref 79–97)
Monocytes Absolute: 0.4 10*3/uL (ref 0.1–0.9)
Monocytes: 4 %
NEUTROS ABS: 6.2 10*3/uL (ref 1.4–7.0)
Neutrophils: 67 %
Platelets: 430 10*3/uL — ABNORMAL HIGH (ref 150–379)
RBC: 4.82 x10E6/uL (ref 3.77–5.28)
RDW: 14.1 % (ref 12.3–15.4)
WBC: 9.4 10*3/uL (ref 3.4–10.8)

## 2017-11-05 LAB — TESTT+TESTF+SHBG
Sex Hormone Binding: 139.3 nmol/L — ABNORMAL HIGH (ref 24.6–122.0)
Testosterone, Free: 3.2 pg/mL (ref 0.0–4.2)
Testosterone, total: 265.8 ng/dL — ABNORMAL HIGH (ref 10.0–55.0)

## 2017-11-05 LAB — TSH: TSH: 2.62 u[IU]/mL (ref 0.450–4.500)

## 2017-11-05 LAB — LIPID PANEL
CHOL/HDL RATIO: 2.3 ratio (ref 0.0–4.4)
Cholesterol, Total: 172 mg/dL (ref 100–199)
HDL: 75 mg/dL (ref >39)
LDL Calculated: 79 mg/dL (ref 0–99)
Triglycerides: 90 mg/dL (ref 0–149)
VLDL CHOLESTEROL CAL: 18 mg/dL (ref 5–40)

## 2017-11-05 LAB — ESTRADIOL: Estradiol: 78.1 pg/mL

## 2017-11-05 LAB — LITHIUM LEVEL: LITHIUM LVL: 0.6 mmol/L (ref 0.6–1.2)

## 2017-11-07 DIAGNOSIS — F3181 Bipolar II disorder: Secondary | ICD-10-CM | POA: Diagnosis not present

## 2017-11-07 DIAGNOSIS — F4310 Post-traumatic stress disorder, unspecified: Secondary | ICD-10-CM | POA: Diagnosis not present

## 2017-11-07 DIAGNOSIS — F4010 Social phobia, unspecified: Secondary | ICD-10-CM | POA: Diagnosis not present

## 2017-11-07 DIAGNOSIS — F410 Panic disorder [episodic paroxysmal anxiety] without agoraphobia: Secondary | ICD-10-CM | POA: Diagnosis not present

## 2017-11-08 ENCOUNTER — Ambulatory Visit: Payer: BLUE CROSS/BLUE SHIELD | Admitting: Physician Assistant

## 2017-11-08 ENCOUNTER — Ambulatory Visit: Payer: Self-pay | Admitting: *Deleted

## 2017-11-08 ENCOUNTER — Other Ambulatory Visit: Payer: Self-pay | Admitting: *Deleted

## 2017-11-08 ENCOUNTER — Ambulatory Visit (INDEPENDENT_AMBULATORY_CARE_PROVIDER_SITE_OTHER): Payer: BLUE CROSS/BLUE SHIELD

## 2017-11-08 ENCOUNTER — Encounter: Payer: Self-pay | Admitting: Physician Assistant

## 2017-11-08 VITALS — BP 113/76 | HR 78 | Temp 97.5°F | Resp 18 | Ht 65.0 in | Wt 189.4 lb

## 2017-11-08 DIAGNOSIS — R079 Chest pain, unspecified: Secondary | ICD-10-CM

## 2017-11-08 DIAGNOSIS — R1013 Epigastric pain: Secondary | ICD-10-CM | POA: Diagnosis not present

## 2017-11-08 DIAGNOSIS — R0789 Other chest pain: Secondary | ICD-10-CM

## 2017-11-08 LAB — POCT CBC
GRANULOCYTE PERCENT: 70.3 % (ref 37–80)
HCT, POC: 39.4 % (ref 37.7–47.9)
Hemoglobin: 13.1 g/dL (ref 12.2–16.2)
Lymph, poc: 2.3 (ref 0.6–3.4)
MCH, POC: 29.3 pg (ref 27–31.2)
MCHC: 33.3 g/dL (ref 31.8–35.4)
MCV: 88 fL (ref 80–97)
MID (CBC): 0.4 (ref 0–0.9)
MPV: 7.1 fL (ref 0–99.8)
PLATELET COUNT, POC: 421 10*3/uL (ref 142–424)
POC Granulocyte: 6.3 (ref 2–6.9)
POC LYMPH %: 25.4 % (ref 10–50)
POC MID %: 4.3 %M (ref 0–12)
RBC: 4.48 M/uL (ref 4.04–5.48)
RDW, POC: 14 %
WBC: 8.9 10*3/uL (ref 4.6–10.2)

## 2017-11-08 MED ORDER — RANITIDINE HCL 150 MG PO TABS: 150.0000 mg | ORAL_TABLET | Freq: Two times a day (BID) | ORAL | 1 refills | Status: DC

## 2017-11-08 NOTE — Patient Instructions (Addendum)
  This is possible reflux, possible costochondritis.  I would like you to take the ranitidine at this time, and follow the diet and lifestyle modifications below.  I would like you to ice the lower chest three times per day for 15 minutes.  This will help if it is a musculoskeletal. Let's follow up in 1 week with me or Clelia CroftShaw.   Food Choices for Gastroesophageal Reflux Disease, Adult When you have gastroesophageal reflux disease (GERD), the foods you eat and your eating habits are very important. Choosing the right foods can help ease your discomfort. What guidelines do I need to follow?  Choose fruits, vegetables, whole grains, and low-fat dairy products.  Choose low-fat meat, fish, and poultry.  Limit fats such as oils, salad dressings, butter, nuts, and avocado.  Keep a food diary. This helps you identify foods that cause symptoms.  Avoid foods that cause symptoms. These may be different for everyone.  Eat small meals often instead of 3 large meals a day.  Eat your meals slowly, in a place where you are relaxed.  Limit fried foods.  Cook foods using methods other than frying.  Avoid drinking alcohol.  Avoid drinking large amounts of liquids with your meals.  Avoid bending over or lying down until 2-3 hours after eating. What foods are not recommended? These are some foods and drinks that may make your symptoms worse: Vegetables Tomatoes. Tomato juice. Tomato and spaghetti sauce. Chili peppers. Onion and garlic. Horseradish. Fruits Oranges, grapefruit, and lemon (fruit and juice). Meats High-fat meats, fish, and poultry. This includes hot dogs, ribs, ham, sausage, salami, and bacon. Dairy Whole milk and chocolate milk. Sour cream. Cream. Butter. Ice cream. Cream cheese. Drinks Coffee and tea. Bubbly (carbonated) drinks or energy drinks. Condiments Hot sauce. Barbecue sauce. Sweets/Desserts Chocolate and cocoa. Donuts. Peppermint and spearmint. Fats and Oils High-fat  foods. This includes Jamaica[place] fries and potato chips. Other Vinegar. Strong spices. This includes black pepper, white pepper, red pepper, cayenne, curry powder, cloves, ginger, and chili powder. The items listed above may not be a complete list of foods and drinks to avoid. Contact your dietitian for more information. This information is not intended to replace advice given to you by your health care provider. Make sure you discuss any questions you have with your health care provider. Document Released: 02/28/2012 Document Revised: 02/04/2016 Document Reviewed: 07/03/2013 Elsevier Interactive Patient Education  2017 [employer]   IF you received an x-ray today, you will receive an invoice from [facility]. Please contact [facility] at [phone] with questions or concerns regarding your invoice.   IF you received labwork today, you will receive an invoice from [facility]. Please contact [facility] at [phone] with questions or concerns regarding your invoice.   Our billing staff will not be able to assist you with questions regarding bills from these companies.  You will be contacted with the lab results as soon as they are available. The fastest way to get your results is to activate your My Chart account. Instructions are located on the last page of this paperwork. If you have not heard from us regarding the results in 2 weeks, please contact this office.

## 2017-11-08 NOTE — Telephone Encounter (Signed)
Pt  Reports   Chest/  Epigastric  Pain   With  Symptoms    For   Several  Hours  Not  3  Days  Pt  descibes  A  Sensation of  Pressure  When  She  Presses  On it  .    Pt  Is  Speaking  In  Complete  sentances    At this   Time . Pt is  At  Work . Appt made  Today  With Stepanie  English    Advised  To  Go to  [place] if  Symptoms  Worse   Reason for Disposition . [1] Chest pain lasts > 5 minutes AND [2] occurred > 3 days ago (72 hours) AND [3] NO chest pain or cardiac symptoms now  Answer Assessment - Initial Assessment Questions 1. LOCATION: "Where does it hurt?"       Bottom  Of  breastbone 2. RADIATION: "Does the pain go anywhere else?" (e.g., into neck, jaw, arms, back)      Sharp pain  Stays  In one  Spot   3. ONSET: "When did the chest pain begin?" (Minutes, hours or days)       sev  Hours  Ago   4. PATTERN "Does the pain come and go, or has it been constant since it started?"  "Does it get worse with exertion?"      Constant  With periods  Of  Brief let   Upside    Nothing  Changes  It   5. DURATION: "How long does it last" (e.g., seconds, minutes, hours)       Sev  Hours   6. SEVERITY: "How bad is the pain?"  (e.g., Scale 1-10; mild, moderate, or severe)    - MILD (1-3): doesn't interfere with normal activities     - MODERATE (4-7): interferes with normal activities or awakens from sleep    - SEVERE (8-10): excruciating pain, unable to do any normal activities          4-5      Sharp  Pain   7. CARDIAC RISK FACTORS: "Do you have any history of heart problems or risk factors for heart disease?" (e.g., prior heart attack, angina; high blood pressure, diabetes, being overweight, high cholesterol, smoking, or strong family history of heart disease)     Overweight     8. PULMONARY RISK FACTORS: "Do you have any history of lung disease?"  (e.g., blood clots in lung, asthma, emphysema, birth control pills)       Asthma   Estrogen    9. CAUSE: "What do you think is causing the chest pain?"  no 10. OTHER SYMPTOMS: "Do you have any other symptoms?" (e.g., dizziness, nausea, vomiting, sweating, fever, difficulty breathing, cough)      Lightheaded   11. PREGNANCY: "Is there any chance you are pregnant?" "When was your last menstrual period?"        No  Protocols used: CHEST PAIN-A-AH

## 2017-11-08 NOTE — Progress Notes (Signed)
Chest PRIMARY CARE AT [facility] [address], [place] [address] [phone]  Date:  11/08/2017   Name:  Janet Tucker   DOB:  1986/03/22   MRN:  [id number]  PCP:  Janet Knapp, MD    History of Present Illness:  Janet Tucker is a 32 y.o. adult patient who presents to PCP with  Chief Complaint  Patient presents with  . Chest Pain    pt states chest pain began this morning. Pt states pain starts in the center of her chest and spreads out.     Chest pain center of chest stabbing sensation persistent and feels like it is spreading across the chest.,  She feels lightheaded.  Symptoms started at work, she is housekeeping.  No nausea.  No diaphoresis.  She felt some sob.  No palpitations.  She has been taking phentermine approximately 6 months.  She denies sour brash.  Mouth is dry.  No fever.   No coughing.   Caffeine intake is 3 cans soda per day.   Denies much alcohol use.  Legs feel tremulous, and feels like the floor is moving  Patient Active Problem List   Diagnosis Date Noted  . Mild intermittent asthma without complication 16/38/4665  . GERD without esophagitis 02/16/2013    Past Medical History:  Diagnosis Date  . Anxiety   . Arthritis   . Asthma   . Depression     Past Surgical History:  Procedure Laterality Date  . BREAST SURGERY     implants    Social History   Tobacco Use  . Smoking status: Never Smoker  . Smokeless tobacco: Never Used  Substance Use Topics  . Alcohol use: Yes    Alcohol/week: 0.6 oz    Types: 1 Shots of liquor per week    Comment: occasionally  . Drug use: No    Family History  Problem Relation Age of Onset  . Diabetes Mother   . Diabetes Father   . Cancer Maternal Grandmother        pancreas  . Mental illness Paternal Grandmother   . Cancer Paternal Grandfather   . Heart disease Paternal Grandfather     No Known Allergies  Medication list has been reviewed and updated.  Current Outpatient Medications on File Prior  to Visit  Medication Sig Dispense Refill  . ARIPiprazole (ABILIFY) 5 MG tablet Take 5 mg by mouth daily.    Marland Kitchen estradiol (ESTRACE) 2 MG tablet Take 1 tablet (2 mg total) by mouth 2 (two) times daily. 180 tablet 1  . FLUoxetine (PROZAC) 20 MG capsule Take 20 mg by mouth daily.    Marland Kitchen gabapentin (NEURONTIN) 600 MG tablet Take 600 mg by mouth 2 (two) times daily.    Marland Kitchen lamoTRIgine (LAMICTAL) 100 MG tablet Take 100 mg by mouth daily.    Marland Kitchen lithium 300 MG tablet Take 300 mg by mouth at bedtime.    Marland Kitchen lithium carbonate (LITHOBID) 300 MG CR tablet Take 2 tablets (600 mg total) by mouth at bedtime. 60 tablet 0  . phentermine 37.5 MG capsule Take 1 capsule (37.5 mg total) by mouth every morning. And 1/2 tablet by mouth every afternoon. 45 capsule 5  . spironolactone (ALDACTONE) 100 MG tablet Take 100 mg by mouth 2 (two) times daily.    Marland Kitchen zolpidem (AMBIEN) 5 MG tablet Take 5 mg by mouth at bedtime.     No current facility-administered medications on file prior to visit.     ROS ROS  otherwise unremarkable unless listed above.  Physical Examination: BP 113/76 (BP Location: Right Arm, Patient Position: Sitting, Cuff Size: Normal)   Pulse 78   Temp (!) 97.5 F (36.4 C) (Oral)   Resp 18   Ht '5\' 5"'$  (1.651 m)   Wt 189 lb 6.4 oz (85.9 kg)   SpO2 100%   BMI 31.52 kg/m  Ideal Body Weight: Weight in (lb) to have BMI = 25: 149.9  Physical Exam  Constitutional: She is oriented to person, place, and time. She appears well-developed and well-nourished. No distress.  HENT:  Head: Normocephalic and atraumatic.  Right Ear: External ear normal.  Left Ear: External ear normal.  Eyes: Conjunctivae and EOM are normal. Pupils are equal, round, and reactive to light.  Cardiovascular: Normal rate.  Pulmonary/Chest: Effort normal. No respiratory distress.  Abdominal: Soft. Normal appearance and bowel sounds are normal. There is tenderness in the epigastric area.  Neurological: She is alert and oriented to person,  place, and time.  Skin: She is not diaphoretic.  Psychiatric: She has a normal mood and affect. Her behavior is normal.   Results for orders placed or performed in visit on 11/08/17  POCT CBC  Result Value Ref Range   WBC 8.9 4.6 - 10.2 K/uL   Lymph, poc 2.3 0.6 - 3.4   POC LYMPH PERCENT 25.4 10 - 50 %L   MID (cbc) 0.4 0 - 0.9   POC MID % 4.3 0 - 12 %M   POC Granulocyte 6.3 2 - 6.9   Granulocyte percent 70.3 37 - 80 %G   RBC 4.48 4.04 - 5.48 M/uL   Hemoglobin 13.1 12.2 - 16.2 g/dL   HCT, POC 39.4 37.7 - 47.9 %   MCV 88.0 80 - 97 fL   MCH, POC 29.3 27 - 31.2 pg   MCHC 33.3 31.8 - 35.4 g/dL   RDW, POC 14.0 %   Platelet Count, POC 421 142 - 424 K/uL   MPV 7.1 0 - 99.8 fL    Dg Chest 2 View  Result Date: 11/08/2017 CLINICAL DATA:  Lower chest and epigastric pain. EXAM: CHEST  2 VIEW COMPARISON:  None in PACs FINDINGS: The lungs are adequately inflated and clear. There is a calcified nodule in the left perihilar region anteriorly. The heart and pulmonary vascularity are normal. The mediastinum is normal in width. There is no pleural effusion. The bony thorax exhibits no acute abnormality. IMPRESSION: There is no pneumonia, CHF, nor other acute cardiopulmonary abnormality. There is evidence of previous granulomatous infection. Electronically Signed   By: Janet Tucker M.D.   On: 11/08/2017 16:37     Assessment and Plan: Janet Tucker is a 32 y.o. adult who is here today for cc of  Chief Complaint  Patient presents with  . Chest Pain    pt states chest pain began this morning. Pt states pain starts in the center of her chest and spreads out.  advised of alarming symptoms to warrant an immediate return.  We will attempt zantac and GERD diet.  Low cardiac correlative hx.  High estrogen use is possible risk factor for PE, however does not quite fit well's criteria.  EKG reviewed without acute findings-- also reviewed by Dr. Mitchel Honour.   Labs also obtained today.     She will return in  1 week.  Other chest pain - Plan: EKG 12-Lead, DG Chest 2 View, ranitidine (ZANTAC) 150 MG tablet  Abdominal pain, epigastric - Plan: POCT CBC, CMP14+EGFR, ranitidine (ZANTAC)  150 MG tablet  Chest pain, unspecified type - Plan: DG Chest 2 View, ranitidine (ZANTAC) 150 MG tablet  Ivar Drape, PA-C [facility] 3/5/20199:09 AM

## 2017-11-09 ENCOUNTER — Encounter: Payer: Self-pay | Admitting: Radiology

## 2017-11-09 LAB — CMP14+EGFR
ALK PHOS: 42 IU/L (ref 39–117)
ALT: 17 IU/L (ref 0–32)
AST: 15 IU/L (ref 0–40)
Albumin/Globulin Ratio: 1.6 (ref 1.2–2.2)
Albumin: 4.1 g/dL (ref 3.5–5.5)
BUN / CREAT RATIO: 19 (ref 9–23)
BUN: 21 mg/dL — AB (ref 6–20)
Bilirubin Total: <0.2 mg/dL (ref 0.0–1.2)
CALCIUM: 9.3 mg/dL (ref 8.7–10.2)
CO2: 21 mmol/L (ref 20–29)
CREATININE: 1.09 mg/dL — AB (ref 0.57–1.00)
Chloride: 103 mmol/L (ref 96–106)
GFR calc Af Amer: 79 mL/min/{1.73_m2} (ref >59)
GFR calc non Af Amer: 68 mL/min/{1.73_m2} (ref >59)
GLUCOSE: 82 mg/dL (ref 65–99)
Globulin, Total: 2.6 g/dL (ref 1.5–4.5)
Potassium: 4.6 mmol/L (ref 3.5–5.2)
Sodium: 137 mmol/L (ref 134–144)
Total Protein: 6.7 g/dL (ref 6.0–8.5)

## 2017-11-14 ENCOUNTER — Encounter: Payer: Self-pay | Admitting: Physician Assistant

## 2017-11-14 ENCOUNTER — Other Ambulatory Visit: Payer: Self-pay | Admitting: Family Medicine

## 2017-11-14 ENCOUNTER — Encounter: Payer: Self-pay | Admitting: Family Medicine

## 2017-11-14 DIAGNOSIS — Z789 Other specified health status: Secondary | ICD-10-CM

## 2017-11-14 DIAGNOSIS — F419 Anxiety disorder, unspecified: Secondary | ICD-10-CM

## 2017-11-14 DIAGNOSIS — M545 Low back pain: Secondary | ICD-10-CM | POA: Diagnosis not present

## 2017-11-14 DIAGNOSIS — F410 Panic disorder [episodic paroxysmal anxiety] without agoraphobia: Secondary | ICD-10-CM | POA: Diagnosis not present

## 2017-11-14 DIAGNOSIS — M9903 Segmental and somatic dysfunction of lumbar region: Secondary | ICD-10-CM | POA: Diagnosis not present

## 2017-11-14 DIAGNOSIS — F32A Depression, unspecified: Secondary | ICD-10-CM | POA: Insufficient documentation

## 2017-11-14 DIAGNOSIS — M542 Cervicalgia: Secondary | ICD-10-CM | POA: Diagnosis not present

## 2017-11-14 DIAGNOSIS — F3181 Bipolar II disorder: Secondary | ICD-10-CM | POA: Insufficient documentation

## 2017-11-14 DIAGNOSIS — M9901 Segmental and somatic dysfunction of cervical region: Secondary | ICD-10-CM | POA: Diagnosis not present

## 2017-11-14 DIAGNOSIS — F640 Transsexualism: Secondary | ICD-10-CM | POA: Insufficient documentation

## 2017-11-14 DIAGNOSIS — F4010 Social phobia, unspecified: Secondary | ICD-10-CM | POA: Diagnosis not present

## 2017-11-14 DIAGNOSIS — F4310 Post-traumatic stress disorder, unspecified: Secondary | ICD-10-CM | POA: Diagnosis not present

## 2017-11-14 DIAGNOSIS — F329 Major depressive disorder, single episode, unspecified: Secondary | ICD-10-CM | POA: Insufficient documentation

## 2017-11-14 HISTORY — DX: Other specified health status: Z78.9

## 2017-11-21 ENCOUNTER — Other Ambulatory Visit: Payer: Self-pay

## 2017-11-21 ENCOUNTER — Encounter: Payer: Self-pay | Admitting: Family Medicine

## 2017-11-21 ENCOUNTER — Ambulatory Visit (INDEPENDENT_AMBULATORY_CARE_PROVIDER_SITE_OTHER): Payer: BLUE CROSS/BLUE SHIELD | Admitting: Family Medicine

## 2017-11-21 VITALS — BP 104/68 | HR 82 | Temp 97.9°F | Resp 18 | Ht 65.0 in | Wt 189.4 lb

## 2017-11-21 DIAGNOSIS — F4310 Post-traumatic stress disorder, unspecified: Secondary | ICD-10-CM | POA: Diagnosis not present

## 2017-11-21 DIAGNOSIS — J4520 Mild intermittent asthma, uncomplicated: Secondary | ICD-10-CM | POA: Diagnosis not present

## 2017-11-21 DIAGNOSIS — E349 Endocrine disorder, unspecified: Secondary | ICD-10-CM | POA: Diagnosis not present

## 2017-11-21 DIAGNOSIS — F4010 Social phobia, unspecified: Secondary | ICD-10-CM | POA: Diagnosis not present

## 2017-11-21 DIAGNOSIS — F3181 Bipolar II disorder: Secondary | ICD-10-CM | POA: Diagnosis not present

## 2017-11-21 LAB — POCT URINALYSIS DIP (MANUAL ENTRY)
BILIRUBIN UA: NEGATIVE
BILIRUBIN UA: NEGATIVE mg/dL
Blood, UA: NEGATIVE
GLUCOSE UA: NEGATIVE mg/dL
LEUKOCYTES UA: NEGATIVE
Nitrite, UA: NEGATIVE
Protein Ur, POC: NEGATIVE mg/dL
Spec Grav, UA: 1.015 (ref 1.010–1.025)
Urobilinogen, UA: 0.2 E.U./dL
pH, UA: 8 (ref 5.0–8.0)

## 2017-11-21 MED ORDER — GLYCOPYRROLATE 2 MG PO TABS: 1.0000 mg | ORAL_TABLET | Freq: Two times a day (BID) | ORAL | 1 refills | Status: DC

## 2017-11-21 MED ORDER — ALBUTEROL SULFATE (2.5 MG/3ML) 0.083% IN NEBU
2.5000 mg | INHALATION_SOLUTION | Freq: Once | RESPIRATORY_TRACT | Status: AC
  Administered 2017-11-21: 2.5 mg via RESPIRATORY_TRACT

## 2017-11-21 MED ORDER — ALBUTEROL SULFATE (2.5 MG/3ML) 0.083% IN NEBU: 2.5000 mg | INHALATION_SOLUTION | Freq: Once | RESPIRATORY_TRACT | Status: DC

## 2017-11-21 MED ORDER — ESTRADIOL 2 MG PO TABS: 2.0000 mg | ORAL_TABLET | Freq: Three times a day (TID) | ORAL | 0 refills | Status: DC

## 2017-11-21 MED ORDER — FINASTERIDE 5 MG PO TABS: 2.5000 mg | ORAL_TABLET | Freq: Every day | ORAL | 0 refills | Status: DC

## 2017-11-21 NOTE — Patient Instructions (Addendum)
Increase your estradiol 2mg  to three a day. Start finasteride 1/2 tab a day. Please come by the office to have your labs checked ~6 weeks (around 01/05/2017). (You can walk-in to the [facility] same-day appointment clinic for this and let the front desk that you are there for a lab-only visit and you do not need to see a provider.)  Then make a follow-up appointment with me ~3-7d later so we can review your levels and check your weight and decide upon further medication adjustment.   IF you received an x-ray today, you will receive an invoice from [facility]. Please contact [facility] at [phone] with questions or concerns regarding your invoice.   IF you received labwork today, you will receive an invoice from [facility]. Please contact [facility] at [phone] with questions or concerns regarding your invoice.   Our billing staff will not be able to assist you with questions regarding bills from these companies.  You will be contacted with the lab results as soon as they are available. The fastest way to get your results is to activate your My Chart account. Instructions are located on the last page of this paperwork. If you have not heard from [place] regarding the results in 2 weeks, please contact this office.     Bronchospasm, Adult Bronchospasm is a tightening of the airways going into the lungs. During an episode, it may be harder to breathe. You may cough, and you may make a whistling sound when you breathe (wheeze). This condition often affects people with asthma. What are the causes? This condition is caused by swelling and irritation in the airways. It can be triggered by:  An infection (common).  Seasonal allergies.  An allergic reaction.  Exercise.  Irritants. These include pollution, cigarette smoke, strong odors, aerosol sprays, and paint fumes.  Weather changes. Winds increase molds and pollens in the air. Cold air may cause swelling.  Stress and  emotional upset.  What are the signs or symptoms? Symptoms of this condition include:  Wheezing. If the episode was triggered by an allergy, wheezing may start right away or hours later.  Nighttime coughing.  Frequent or severe coughing with a simple cold.  Chest tightness.  Shortness of breath.  Decreased ability to exercise.  How is this diagnosed? This condition is usually diagnosed with a review of your medical history and a physical exam. Tests, such as lung function tests, are sometimes done to look for other conditions. The need for a chest X-ray depends on where the wheezing occurs and whether it is the first time you have wheezed. How is this treated? This condition may be treated with:  Inhaled medicines. These open up the airways and help you breathe. They can be taken with an inhaler or a nebulizer device.  Corticosteroid medicines. These may be given for severe bronchospasm, usually when it is associated with asthma.  Avoiding triggers, such as irritants, infection, or allergies.  Follow these instructions at home: Medicines  Take over-the-counter and prescription medicines only as told by your health care provider.  If you need to use an inhaler or nebulizer to take your medicine, ask your health care provider to explain how to use it correctly. If you were given a spacer, always use it with your inhaler. Lifestyle  Reduce the number of triggers in your home. To do this: ? Change your heating and air conditioning filter at least once a month. ? Limit your use of fireplaces and wood stoves. ? Do  not smoke. Do not allow smoking in your home. ? Avoid using perfumes and fragrances. ? Get rid of pests, such as roaches and mice, and their droppings. ? Remove any mold from your home. ? Keep your house clean and dust free. Use unscented cleaning products. ? Replace carpet with wood, tile, or vinyl flooring. Carpet can trap dander and dust. ? Use allergy-proof  pillows, mattress covers, and box spring covers. ? Wash bed sheets and blankets every week in hot water. Dry them in a dryer. ? Use blankets that are made of polyester or cotton. ? Wash your hands often. ? Do not allow pets in your bedroom.  Avoid breathing in cold air when you exercise. General instructions  Have a plan for seeking medical care. Know when to call your health care provider and local emergency services, and where to get emergency care.  Stay up to date on your immunizations.  When you have an episode of bronchospasm, stay calm. Try to relax and breathe more slowly.  If you have asthma, make sure you have an asthma action plan.  Keep all follow-up visits as told by your health care provider. This is important. Contact a health care provider if:  You have muscle aches.  You have chest pain.  The mucus that you cough up (sputum) changes from clear or white to yellow, green, gray, or bloody.  You have a fever.  Your sputum gets thicker. Get help right away if:  Your wheezing and coughing get worse, even after you take your prescribed medicines.  It gets even harder to breathe.  You develop severe chest pain. Summary  Bronchospasm is a tightening of the airways going into the lungs.  During an episode of bronchospasm, you may have a harder time breathing. You may cough and make a whistling sound when you breathe (wheeze).  Avoid exposure to triggers such as smoke, dust, mold, animal dander, and fragrances.  When you have an episode of bronchospasm, stay calm. Try to relax and breathe more slowly. This information is not intended to replace advice given to you by your health care provider. Make sure you discuss any questions you have with your health care provider. Document Released: 09/01/2003 Document Revised: 08/25/2016 Document Reviewed: 08/25/2016 Elsevier Interactive Patient Education  2017 [employer]
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
## 2017-12-04 DIAGNOSIS — F3181 Bipolar II disorder: Secondary | ICD-10-CM | POA: Diagnosis not present

## 2017-12-04 DIAGNOSIS — F4010 Social phobia, unspecified: Secondary | ICD-10-CM | POA: Diagnosis not present

## 2017-12-04 DIAGNOSIS — F4310 Post-traumatic stress disorder, unspecified: Secondary | ICD-10-CM | POA: Diagnosis not present

## 2017-12-12 ENCOUNTER — Encounter: Payer: Self-pay | Admitting: Physician Assistant

## 2017-12-12 DIAGNOSIS — F4310 Post-traumatic stress disorder, unspecified: Secondary | ICD-10-CM | POA: Diagnosis not present

## 2017-12-12 DIAGNOSIS — F4010 Social phobia, unspecified: Secondary | ICD-10-CM | POA: Diagnosis not present

## 2017-12-12 DIAGNOSIS — F3181 Bipolar II disorder: Secondary | ICD-10-CM | POA: Diagnosis not present

## 2017-12-13 ENCOUNTER — Other Ambulatory Visit: Payer: Self-pay | Admitting: Family Medicine

## 2017-12-13 NOTE — Telephone Encounter (Signed)
Lithium refill request  LOV 11/21/17 with Dr. Marlis EdelsonShaw  [id number] [place], [place]   [id number]

## 2017-12-19 DIAGNOSIS — F3181 Bipolar II disorder: Secondary | ICD-10-CM | POA: Diagnosis not present

## 2017-12-19 DIAGNOSIS — F4310 Post-traumatic stress disorder, unspecified: Secondary | ICD-10-CM | POA: Diagnosis not present

## 2017-12-19 DIAGNOSIS — F4010 Social phobia, unspecified: Secondary | ICD-10-CM | POA: Diagnosis not present

## 2017-12-26 DIAGNOSIS — F3181 Bipolar II disorder: Secondary | ICD-10-CM | POA: Diagnosis not present

## 2017-12-26 DIAGNOSIS — F4010 Social phobia, unspecified: Secondary | ICD-10-CM | POA: Diagnosis not present

## 2017-12-26 DIAGNOSIS — F4310 Post-traumatic stress disorder, unspecified: Secondary | ICD-10-CM | POA: Diagnosis not present

## 2017-12-28 ENCOUNTER — Other Ambulatory Visit: Payer: Self-pay

## 2017-12-28 ENCOUNTER — Ambulatory Visit: Payer: BLUE CROSS/BLUE SHIELD | Admitting: Family Medicine

## 2017-12-28 ENCOUNTER — Encounter: Payer: Self-pay | Admitting: Family Medicine

## 2017-12-28 VITALS — BP 116/80 | HR 85 | Temp 98.6°F | Resp 18 | Ht 65.0 in | Wt 194.8 lb

## 2017-12-28 DIAGNOSIS — K5909 Other constipation: Secondary | ICD-10-CM

## 2017-12-28 DIAGNOSIS — E6609 Other obesity due to excess calories: Secondary | ICD-10-CM

## 2017-12-28 DIAGNOSIS — R635 Abnormal weight gain: Secondary | ICD-10-CM | POA: Diagnosis not present

## 2017-12-28 DIAGNOSIS — Z5181 Encounter for therapeutic drug level monitoring: Secondary | ICD-10-CM

## 2017-12-28 DIAGNOSIS — Z6832 Body mass index (BMI) 32.0-32.9, adult: Secondary | ICD-10-CM | POA: Diagnosis not present

## 2017-12-28 LAB — POCT GLYCOSYLATED HEMOGLOBIN (HGB A1C): Hemoglobin A1C: 4.9

## 2017-12-28 LAB — GLUCOSE, POCT (MANUAL RESULT ENTRY): POC GLUCOSE: 91 mg/dL (ref 70–99)

## 2017-12-28 MED ORDER — LISDEXAMFETAMINE DIMESYLATE 50 MG PO CAPS: 50.0000 mg | ORAL_CAPSULE | Freq: Every day | ORAL | 0 refills | Status: DC

## 2017-12-28 MED ORDER — POLYETHYLENE GLYCOL 3350 17 GM/SCOOP PO POWD: 17.0000 g | Freq: Two times a day (BID) | ORAL | 1 refills | Status: DC

## 2017-12-28 MED ORDER — LACTULOSE 10 GM/15ML PO SOLN: 20.0000 g | Freq: Two times a day (BID) | ORAL | 3 refills | Status: DC | PRN

## 2017-12-28 NOTE — Patient Instructions (Addendum)
IF you received an x-ray today, you will receive an invoice from [facility]. Please contact [facility] at [phone] with questions or concerns regarding your invoice.   IF you received labwork today, you will receive an invoice from [facility]. Please contact [facility] at [phone] with questions or concerns regarding your invoice.   Our billing staff will not be able to assist you with questions regarding bills from these companies.  You will be contacted with the lab results as soon as they are available. The fastest way to get your results is to activate your My Chart account. Instructions are located on the last page of this paperwork. If you have not heard from [place] regarding the results in 2 weeks, please contact this office.     Preventing Complications From Unhealthy Weight Loss Behaviors, Adult Reaching and maintaining a healthy weight is important for your overall health. It is natural to want to lose weight quickly, using whatever methods seem fastest. However, losing weight in a healthy way is not a quick process. Instead, aim for slow, steady weight loss. What lifestyle changes can be made? You can make certain lifestyle changes to help you lose weight in a healthy way. These include eating nutritious foods and exercising regularly. What to avoid:  Following a diet that restricts entire types of food.  Skipping meals to save calories. It is especially important to eat breakfast.  Not eating anything for long periods of time (fasting).  Restricting your calories to far less than the amount that you need to lose or maintain a healthy weight.  Compulsively getting an extreme amount of exercise.  Taking laxative pills to make you have more frequent bowel movements.  Taking medicines to make your body lose excess fluids (diuretics).  Eating an excessive amount of food (bingeing), then making yourself vomit (purging). Healthy behaviors:   Eat a  variety of healthy foods, including: ? Fruits and vegetables. ? Whole grains. ? Lean proteins. ? Low-fat dairy products.  Drink water instead of sugary drinks.  Plan healthy, low-calorie meals. Work with a Financial planner (dietitian) to make a healthy meal plan that works for you and to make an exercise program. ? Include different types of exercise in your exercise program, such as strengthening, aerobic, and flexibility exercises. ? To maintain your weight, get at least 150 minutes of moderate-intensity exercise every week. Moderate-intensity exercise could be brisk walking or biking. ? To lose a healthy amount of weight, get 60 minutes of moderate-intensity exercise each day.  Find ways to reduce stress, such as regular exercise or meditation.  Find a hobby or other activity that you enjoy to distract you from eating when you feel stressed or bored. Why are these changes important? Making these changes will improve your overall health. Maintaining a healthy weight also lowers your risk of certain conditions, including:  Heart disease.  High cholesterol.  High blood pressure.  Type 2 diabetes.  Stroke.  Osteoarthritis.  Osteoporosis.  Some types of cancer.  Breathing and sleeping disorders.  What can happen if changes are not made? Using disordered eating or other unhealthy eating behaviors to try to lose weight can cause:  Fatigue.  Imbalances in electrolytes and chemicals that your body needs to work properly.  Organ damage or failure, especially of the kidneys.  Dehydration.  Imbalances in body fluids.  Low heart rate and blood pressure.  Thin bones that break easily.  Social isolation or relationship problems with your friends and family.  Emotional distress, including depression and anxiety.  A greater risk of an eating disorder.  If you develop an eating disorder, you could develop serious health problems and complications that affect  yourorgans and bodily processes. These include:  Dry skin and hair.  Hair loss.  Fainting.  Difficulty getting pregnant.  Changes in your heart muscle and the way your heart works.  Severe dehydration.  Damage to the digestive tract and digestive problems.  Long-term (chronic) gastrointestinal problems.  High blood pressure.  High cholesterol.  Heart disease.  Type 2 diabetes.  Where to find support: For more support, talk with:  Your health care provider or dietitian. Ask about support groups.  A mental health care provider.  Family and friends.  Where to find more information: Learn more about how to prevent complications from unhealthy weight loss behaviors from:  Centers for Disease Control and Prevention: https://harper.com/  [employer] of Mental Health: InsuranceStats.ca.shtml  National Eating Disorders Association: www.nationaleatingdisorders.org  Contact a health care provider if:  You often feel very tired.  You notice changes in your skin or your hair.  You faint because of dehydration or too much exercise.  You struggle to change your unhealthy weight loss behaviors on your own.  Unhealthy weight loss behaviors are affecting your daily life.  You have signs or symptoms of an eating disorder.  You have major weight changes in a short period of time.  You feel guilty or ashamed about eating or exercising.  You have trouble with your relationships because of your weight loss habits. Summary  Aim for slow, steady weight loss by choosing healthy foods, getting enough calories every day, and exercising regularly.  If you cannot make these changes by yourself, or if you think that you may have an eating disorder, contact your health care provider. This information is not intended to replace advice given to you by your health care provider.  Make sure you discuss any questions you have with your health care provider. Document Released: 09/13/2015 Document Revised: 03/18/2016 Document Reviewed: 09/13/2015 Elsevier Interactive Patient Education  2018 [employer].  Restaurants  Eating out at restaurants has become a way of life for most of [place].  On an average basis, Americans eat more than 25% of their meals away from home.  When eating a meal at a restaurant, it is important to plan and think ahead about what healthy food choices are available. In addition, it is important to think that every meal out is a special occasion and to practice portion control.  Most restaurants serve portions that are 2-3 times bigger than what is considered normal.  There are several ways to control these portion sizes:  Share the meal with another person, have half of the meal bagged "to go" before eating, or eat half and leave the rest behind.  If the bread basket is your weakness, ask that it not be served.  Portion sizes also pertain to drinks. Water, diet soda, and unsweetened iced tea are the best calorie free beverages available. Multiple refills of regular soda and alcoholic beverages greatly increase total calorie intake.   The following items offer smart, low calore choice when eating at a restaurant as well as the appropriate serving sizes:  Fast Food: Fried chicken sandwich with lettuce and tomato, mayonnaise, cheese and [place] fries should be avoided.  Many salads at fast food restaurants have more calories and fat than a hamburger.  Make sure salad has grilled meat only with no cheese  Or nuts.  Fat free or light dressings should always be used.  [place] Food: 1 cup egg drop soup or 1 cup of Chinese vegetables with shrimp or tofin.  Avoid all fried food, including fried rice.  One half to 1 cup steamed white rice is also acceptable.   [place] Food: 1 small bean burrito or 2 chicken fajitas without cheese, sour cream and guacamole.  Fat free or  light sour cream is allowed.  [place] Food: Spaghetti with [employer] (1 cup spaghetti, 1/2 cup sauce).  Cram sauces (alfredo) and fried foods (chicken, eggplant, veal parmesan) should be avoided.  [place] Food: Sushi and teriyaki fish or chicken are healthy options served with steamed vegetables.  Brunch Buffet: 1 cup fruit salad, 1 cup oatmeal, 2 pancakes with light syrup, 1/2 cup scrambled eggs, toast with jelly.  Biscuits and gravy, bacon and sausage should be avoided.  [place] bacon is acceptable.  Commitment- Commitment is a critical part of any successful project and is the difference between failure and success.  It involves focusing on your goal seriously and using will power to get through the rough times.  This allows you to discover inner strengths and develop a confident and an in control new you.   Dieting is not an easy task and there are bound to be rough times.  However, if your commitment to yourself and your weight loss is strong, you will be successful in achieving your goal.  Don't break your promise to yourself.    Diet Modification:  The main components of a healthy diet include 3 small meals with 3 healthy snakes throughout each day.  Your should not go more than 4 hours without eating a small snack or meal.  This helps avoid "starvation" and decreased the urge to choose unhealthy foods. The following guidelines should be followed through the day:  Focus on portion size and control   It is important to read nutrition labels so that you are aware of the appropriate serving size  For those foods that do not have a nutrition label, you can generally use your had as a guide for the appropriate servings sizes.  Fist 1 cup or medium whole fruit  Thumb (tip to base) 1 ounce of meat or chees  Thumb tip (tip to 1st joint) 1 tablespoon  Fingertip (tip to 1st joint) 1 teaspoon  Cupped hand 1-2 ounces of nuts, pretzels, popcorn  Palm (minus fingers) 3 ounces of meat, fish or  poultry    Eat breakfast daily to increase metabolism and prevent loss of control later in the day.  Research has repeatedly shown that patients who eat breakfast daily lose more weight and keep the weight off more effectively than patients who skip breakfast.  One serving of high fiber cereal (> 3 grams of fiber) with 1 cup of skim milk.  One serving of old fashioned oatmeal ( not instant) made with skim milk.  May add 1/2 cup chopped fruit an sugar substitute for added flavor.  Two egg omelet made with egg beaters. May add 1 tablespoon reduced fat cheese and unlimited vegetables.  One slice whole grain toast with 1 teaspoon Smart Beat margarine.  Avoid fruit juice since it has too many sugars and too little fiber. Coffee and tea fine with sugar substitute and skim or fat free dairy creamer.  Mid morning snack-choosing one of the following will help keep the craving under control.  One piece of fruit-apple, pear, banana, 1 cup of grapes  One half cup 2% cottage cheese  One container of fat free yogurt  One low fat mozzarella cheese stick  1 teaspoon natural peanut butter on celery sticks  1-2 cups air popped popcorn sprayed no more than twice with fat free margarine spray (May only have one per day)    Lunch  Start with an all vegetable salad ( no cheese or croutons with 1 teaspoon fat free or light dressing salad dressing.  Many patients find it convenient to eat a prepared meal such as [employer], [employer], [employer], [employer].  It is important to alternate these prepared meals with homemade meals due to their high salt content.  See dinner options for homemade meal ideas.  May add 1/2 cup rice to meal if prepared meal is not adequate  Piece of fruit.  Mid Afternoon Snack-Same as mid morning snack  Dinner  May pick one serving from each of the following categories (derived from [employer]'s best selling book, :"Body for Live").  Start with an all  vegetable salad (no cheese or croutons) with 1 teaspoon of fat free or light salad dressing or 1 cup low fat broth based soup Good Proteins (Palm-size portion 4oz) Good Carbohydrates (Fist-sized portion) Good Vegetables  Baked or grilled chicken breast Baked Potato Broccoli  [place] Breast Sweet Potato Asparagus  Lean ground [place] Yam Lettuce  Fish (baked, broiled, grilled, blackened allowed) [employer] ( 1/2-1 cup) Carrots  Tuna (Fresh or canned in water) Wild rice (1/2-1 cup) Cauliflower  Crab Pasta ( 1 cup whole grain) Green Beans  Shrimp Oatmeal Green Peppers  Top round steak Beans(red, pinto, black) Mushrooms   Top sirloin steak [employer] account executive ground beef Strawberries Tomato  Low-fat cottage cheese Whole wheat bread (1 slice regular 2 slice light) Artichoke    Cabbage    Zucchini    Cucumber    Onion    Prepare these foods using low-fat techniques such as grilling, baking, broiling, blackening meats and steaming vegetables.  Avoid canned vegetables and fruits to decrease sodium intake.  Fresh and froze produce offer the most nutritional value  Evening Snack or "Dessert"  1/2 cup skim milk with 1 package of No sugar added hot cocoa mix  1 serving of Weight Watchers frozen desserts, Fudgesicle bar, Skinny cow ice cream treats  1 cup Sugar Free Jell-O or instant pudding topped with 1 teaspoon fat free whipped topping    1-2 cups air popped popcorn sprayed no more than twice with Fat Free Margarine Spray (may only have once per day)  Water  Eight glasses of water daily (lemon, sugar substitute, and crystal light accepted)  All regular sodas and sweetened tea are NOT allowed.  This results in taking in too many empty calories.  Two diet sodas per day are allowed as are an unlimited amount of decaffeinated coffee and tea as long as sugar substitute (Splenda, Sweet and Low, Equal) only are used for sweetening.  Water naturally suppresses the appetite and helps the body  metabolize stored fat  Drinking enough water is the best treatment for fluid retention (unless you have been diagnosed with kidney deficiency).  When the body gets less water, it senses it as a threat and holds on to the water.  It is stored in places such as the feet, legs and hands.  By drinking plenty of water, the problem of water, the problem of water retention is solved and weight loss occurs.  The above diet modifications are  based on the following principles:  25-35 grams of fiber daily (found in vegetables, whole grain, oats, beans and fruits)  Choose healthy carbohydrates that are rich in fiber, vitamins, and minerals (wheat bread vs white bread, wheat pasta vs regular pasta)  Healthy fats: Choose canola or olive oil if you must use oil to cook with.  Pam cooking spray is a good substitute.  Smart Choice margarine is a healthy alternative to other margarine and butter options.  Baked goods and fried foods should be avoided to decrease calorie intake harmful trans fatty acids and saturated fat absorb in the body.  Healthy Proteins: Americans consume far too much red meat (high amount of saturated fat). Healthier sources of protein are baked or broiled fish,poultry (without skin), beans, low-fat cottage cheese, egg whites and egg substitutes.  If you choose red meat, pick lean cuts such as top round, to sirloin or round,  The following will be implemented to help assist you in achieving successful weight loss on a weekly basis:  Food Diary: Keeping a record of all food you have eaten on a daily basis helps to make you aware of your food choices and helps determine the amount you have eaten.  It also serves as a great reference to look back to see what you did well on during the weeks you were most successful.  Weekly Meal Plan: Plan your meals and go grocery shopping on a weekly basis.  Those patients that plan their meals are far more successful on reaching their goals than other patients.   Without a plan, it is easy to fall back into old eating habits.  Weekly weigh in: Patients who report weekly for weight checks do better than those that try to diet without help.  Knowing your weight will be checked weekly helps keep you on track as well as staying motivated by the encouragement received on a weekly basis.    Exercise  You will need to begin exercising at least 5 days a week.  If you are just beginning an exercise program, start slowly at 15-20 minutes and gradually work up to a longer period of time.  If you are tolerating the exercise well you can increase by 5 minutes each day.  You may use any form of exercise you wish (walking, aerobics, treadmill, stair stepper, elliptical machine).  If you can walk two miles in 30-40 minutes,  You are at a good pace for a successful start.   Do not over do it!!  Make sure you can carry on a conversation with someone or able to whistle while you exercise.  Once you adjust your pace and are breathing normally, step up your pace as you can tolerate it.   The following facilities in [place] area offer gym memberships:  [facility]: Only $9.00 a month  [employer]: [phone]  [facility]: [phone] ($29.00 per month)  [facility] [phone]  Medication  When medically appropriate, you will be prescribed a medication to control your appetite.  This will allow you to help follow the dietary recommendations.  Only one prescription for a 30 day supply can be given per visit, so do not misplace your prescription or medication.  It is important to remember that the purpose of the medication is to help bring your appetite under control and will be used for a short time.  You need to change your daily habits if you are to be successful in this program. If side effects such as  severe headaches, nausea, and vomiting or skin rash develop, discontinue your medication and call your healthcare provider.   Exercising to Lose  Weight Exercising can help you to lose weight. In order to lose weight through exercise, you need to do vigorous-intensity exercise. You can tell that you are exercising with vigorous intensity if you are breathing very hard and fast and cannot hold a conversation while exercising. Moderate-intensity exercise helps to maintain your current weight. You can tell that you are exercising at a moderate level if you have a higher heart rate and faster breathing, but you are still able to hold a conversation. How often should I exercise? Choose an activity that you enjoy and set realistic goals. Your health care provider can help you to make an activity plan that works for you. Exercise regularly as directed by your health care provider. This may include:  Doing resistance training twice each week, such as: ? Push-ups. ? Sit-ups. ? Lifting weights. ? Using resistance bands.  Doing a given intensity of exercise for a given amount of time. Choose from these options: ? 150 minutes of moderate-intensity exercise every week. ? 75 minutes of vigorous-intensity exercise every week. ? A mix of moderate-intensity and vigorous-intensity exercise every week.  Children, pregnant women, people who are out of shape, people who are overweight, and older adults may need to consult a health care provider for individual recommendations. If you have any sort of medical condition, be sure to consult your health care provider before starting a new exercise program. What are some activities that can help me to lose weight?  Walking at a rate of at least 4.5 miles an hour.  Jogging or running at a rate of 5 miles per hour.  Biking at a rate of at least 10 miles per hour.  Lap swimming.  Roller-skating or in-line skating.  Cross-country skiing.  Vigorous competitive sports, such as football, basketball, and soccer.  Jumping rope.  Aerobic dancing. How can I be more active in my day-to-day activities?  Use  the stairs instead of the elevator.  Take a walk during your lunch break.  If you drive, park your car farther away from work or school.  If you take public transportation, get off one stop early and walk the rest of the way.  Make all of your phone calls while standing up and walking around.  Get up, stretch, and walk around every 30 minutes throughout the day. What guidelines should I follow while exercising?  Do not exercise so much that you hurt yourself, feel dizzy, or get very short of breath.  Consult your health care provider prior to starting a new exercise program.  Wear comfortable clothes and shoes with good support.  Drink plenty of water while you exercise to prevent dehydration or heat stroke. Body water is lost during exercise and must be replaced.  Work out until you breathe faster and your heart beats faster. This information is not intended to replace advice given to you by your health care provider. Make sure you discuss any questions you have with your health care provider. Document Released: 10/01/2010 Document Revised: 02/04/2016 Document Reviewed: 01/30/2014 Elsevier Interactive Patient Education  [employer].
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
## 2017-12-31 LAB — TSH+T4F+T3FREE
Free T4: 1.16 ng/dL (ref 0.82–1.77)
T3, Free: 3.3 pg/mL (ref 2.0–4.4)
TSH: 3.49 u[IU]/mL (ref 0.450–4.500)

## 2017-12-31 LAB — COMPREHENSIVE METABOLIC PANEL
ALBUMIN: 4.2 g/dL (ref 3.5–5.5)
ALK PHOS: 44 IU/L (ref 39–117)
ALT: 15 IU/L (ref 0–32)
AST: 16 IU/L (ref 0–40)
Albumin/Globulin Ratio: 1.5 (ref 1.2–2.2)
BUN / CREAT RATIO: 15 (ref 9–23)
BUN: 15 mg/dL (ref 6–20)
CHLORIDE: 99 mmol/L (ref 96–106)
CO2: 24 mmol/L (ref 20–29)
Calcium: 9.6 mg/dL (ref 8.7–10.2)
Creatinine, Ser: 0.97 mg/dL (ref 0.57–1.00)
GFR calc non Af Amer: 78 mL/min/{1.73_m2} (ref >59)
GFR, EST AFRICAN AMERICAN: 90 mL/min/{1.73_m2} (ref >59)
GLUCOSE: 95 mg/dL (ref 65–99)
Globulin, Total: 2.8 g/dL (ref 1.5–4.5)
POTASSIUM: 4.6 mmol/L (ref 3.5–5.2)
Sodium: 134 mmol/L (ref 134–144)
TOTAL PROTEIN: 7 g/dL (ref 6.0–8.5)

## 2017-12-31 LAB — VITAMIN B12: VITAMIN B 12: 231 pg/mL — AB (ref 232–1245)

## 2017-12-31 LAB — TESTT+TESTF+SHBG
SEX HORMONE BINDING: 176.5 nmol/L — AB (ref 24.6–122.0)
TESTOSTERONE FREE: 1.4 pg/mL (ref 0.0–4.2)
TESTOSTERONE, TOTAL: 33.4 ng/dL (ref 10.0–55.0)

## 2017-12-31 LAB — ESTRADIOL: Estradiol: 132.1 pg/mL

## 2017-12-31 LAB — INSULIN, RANDOM: INSULIN: 17.6 u[IU]/mL (ref 2.6–24.9)

## 2018-01-04 DIAGNOSIS — F4310 Post-traumatic stress disorder, unspecified: Secondary | ICD-10-CM | POA: Diagnosis not present

## 2018-01-04 DIAGNOSIS — F4010 Social phobia, unspecified: Secondary | ICD-10-CM | POA: Diagnosis not present

## 2018-01-04 DIAGNOSIS — F3181 Bipolar II disorder: Secondary | ICD-10-CM | POA: Diagnosis not present

## 2018-01-09 ENCOUNTER — Ambulatory Visit: Payer: BLUE CROSS/BLUE SHIELD

## 2018-01-15 ENCOUNTER — Other Ambulatory Visit: Payer: Self-pay

## 2018-01-15 ENCOUNTER — Encounter: Payer: Self-pay | Admitting: Family Medicine

## 2018-01-15 ENCOUNTER — Ambulatory Visit: Payer: BLUE CROSS/BLUE SHIELD | Admitting: Family Medicine

## 2018-01-15 VITALS — BP 104/68 | HR 81 | Temp 98.2°F | Resp 18 | Ht 65.0 in | Wt 196.2 lb

## 2018-01-15 DIAGNOSIS — F640 Transsexualism: Secondary | ICD-10-CM | POA: Diagnosis not present

## 2018-01-15 DIAGNOSIS — E661 Drug-induced obesity: Secondary | ICD-10-CM | POA: Diagnosis not present

## 2018-01-15 DIAGNOSIS — Z789 Other specified health status: Secondary | ICD-10-CM

## 2018-01-15 DIAGNOSIS — Z79899 Other long term (current) drug therapy: Secondary | ICD-10-CM

## 2018-01-15 DIAGNOSIS — F329 Major depressive disorder, single episode, unspecified: Secondary | ICD-10-CM

## 2018-01-15 DIAGNOSIS — F32A Depression, unspecified: Secondary | ICD-10-CM

## 2018-01-15 DIAGNOSIS — Z6832 Body mass index (BMI) 32.0-32.9, adult: Secondary | ICD-10-CM

## 2018-01-15 DIAGNOSIS — E538 Deficiency of other specified B group vitamins: Secondary | ICD-10-CM | POA: Diagnosis not present

## 2018-01-15 DIAGNOSIS — E66811 Obesity, class 1: Secondary | ICD-10-CM

## 2018-01-15 DIAGNOSIS — F419 Anxiety disorder, unspecified: Secondary | ICD-10-CM

## 2018-01-15 DIAGNOSIS — F3181 Bipolar II disorder: Secondary | ICD-10-CM | POA: Diagnosis not present

## 2018-01-15 MED ORDER — CYANOCOBALAMIN 1000 MCG/ML IJ SOLN
1000.0000 ug | Freq: Once | INTRAMUSCULAR | Status: AC
  Administered 2018-01-15: 1000 ug via INTRAMUSCULAR

## 2018-01-15 MED ORDER — PHENTERMINE HCL 37.5 MG PO TABS: 37.5000 mg | ORAL_TABLET | Freq: Two times a day (BID) | ORAL | 1 refills | Status: DC

## 2018-01-15 MED ORDER — ARIPIPRAZOLE 5 MG PO TABS: 2.5000 mg | ORAL_TABLET | Freq: Every day | ORAL | 0 refills | Status: DC
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
## 2018-01-15 NOTE — Patient Instructions (Addendum)
Depending on your [employer] plan, nutrition services may be covered for just the cost of a copay or even free. Check with your insurance to learn more about your coverage or the nutritionist can help you do this.  A local nutritionist is [employer] - I think she runs it out of her house - but it very pro-food and helps you figure out how to enjoy the things you like most while cutting corners where you don't need so you don't have to diet but can live a healthy lifestyle for your body.  Check out her website. [employer] Megan Hadley [address]  [place], [place] [address]  [phone]  Also have had pt have very good luck with: Minda Ditto, RD, CDE  [facility]  [address] [place], [place] [address]  [phone]    [facility] has a new weight loss clinic in [place] on [address] So far, they seem to be getting good results and since it is medically based your insurance might provide better coverage.  It is probably worth checking to see what your ins coverage is. It is called [employer] and you can call [phone] option 1 for more info.  There is also [facility] - they are also getting good results but have a long wait list to get in though I went ahead and placed a referral for you and marked it "emergency" in hopes of getting you in asap - doesn't hurt to try.     IF you received an x-ray today, you will receive an invoice from [facility]. Please contact [facility] at [phone] with questions or concerns regarding your invoice.   IF you received labwork today, you will receive an invoice from [facility]. Please contact [facility] at [phone] with questions or concerns regarding your invoice.   Our billing staff will not be able to assist you with questions regarding bills from these companies.  You will be contacted with the lab results as soon as they are available. The  fastest way to get your results is to activate your My Chart account. Instructions are located on the last page of this paperwork. If you have not heard from [place] regarding the results in 2 weeks, please contact this office.

## 2018-01-16 DIAGNOSIS — F4310 Post-traumatic stress disorder, unspecified: Secondary | ICD-10-CM | POA: Diagnosis not present

## 2018-01-16 DIAGNOSIS — F4010 Social phobia, unspecified: Secondary | ICD-10-CM | POA: Diagnosis not present

## 2018-01-16 DIAGNOSIS — F3181 Bipolar II disorder: Secondary | ICD-10-CM | POA: Diagnosis not present

## 2018-01-18 ENCOUNTER — Encounter (INDEPENDENT_AMBULATORY_CARE_PROVIDER_SITE_OTHER): Payer: BLUE CROSS/BLUE SHIELD

## 2018-01-22 DIAGNOSIS — F4010 Social phobia, unspecified: Secondary | ICD-10-CM | POA: Diagnosis not present

## 2018-01-22 DIAGNOSIS — F3181 Bipolar II disorder: Secondary | ICD-10-CM | POA: Diagnosis not present

## 2018-01-22 DIAGNOSIS — F4310 Post-traumatic stress disorder, unspecified: Secondary | ICD-10-CM | POA: Diagnosis not present

## 2018-01-30 DIAGNOSIS — F4310 Post-traumatic stress disorder, unspecified: Secondary | ICD-10-CM | POA: Diagnosis not present

## 2018-01-30 DIAGNOSIS — F3181 Bipolar II disorder: Secondary | ICD-10-CM | POA: Diagnosis not present

## 2018-01-30 DIAGNOSIS — F4010 Social phobia, unspecified: Secondary | ICD-10-CM | POA: Diagnosis not present

## 2018-01-31 ENCOUNTER — Ambulatory Visit (INDEPENDENT_AMBULATORY_CARE_PROVIDER_SITE_OTHER): Payer: BLUE CROSS/BLUE SHIELD | Admitting: Family Medicine

## 2018-01-31 ENCOUNTER — Encounter (INDEPENDENT_AMBULATORY_CARE_PROVIDER_SITE_OTHER): Payer: Self-pay | Admitting: Family Medicine

## 2018-01-31 VITALS — BP 103/71 | HR 82 | Temp 97.8°F | Ht 65.0 in | Wt 196.0 lb

## 2018-01-31 DIAGNOSIS — R0602 Shortness of breath: Secondary | ICD-10-CM

## 2018-01-31 DIAGNOSIS — Z6832 Body mass index (BMI) 32.0-32.9, adult: Secondary | ICD-10-CM

## 2018-01-31 DIAGNOSIS — Z0289 Encounter for other administrative examinations: Secondary | ICD-10-CM

## 2018-01-31 DIAGNOSIS — R5383 Other fatigue: Secondary | ICD-10-CM | POA: Diagnosis not present

## 2018-01-31 DIAGNOSIS — F419 Anxiety disorder, unspecified: Secondary | ICD-10-CM | POA: Diagnosis not present

## 2018-01-31 DIAGNOSIS — Z1331 Encounter for screening for depression: Secondary | ICD-10-CM

## 2018-01-31 DIAGNOSIS — Z9189 Other specified personal risk factors, not elsewhere classified: Secondary | ICD-10-CM | POA: Diagnosis not present

## 2018-01-31 DIAGNOSIS — E669 Obesity, unspecified: Secondary | ICD-10-CM | POA: Diagnosis not present

## 2018-01-31 DIAGNOSIS — R0600 Dyspnea, unspecified: Secondary | ICD-10-CM | POA: Insufficient documentation

## 2018-02-01 LAB — CBC WITH DIFFERENTIAL
Basophils Absolute: 0 10*3/uL (ref 0.0–0.2)
Basos: 0 %
EOS (ABSOLUTE): 0.5 10*3/uL — AB (ref 0.0–0.4)
Eos: 4 %
HEMOGLOBIN: 13.8 g/dL (ref 11.1–15.9)
Hematocrit: 42 % (ref 34.0–46.6)
IMMATURE GRANS (ABS): 0 10*3/uL (ref 0.0–0.1)
IMMATURE GRANULOCYTES: 0 %
LYMPHS: 19 %
Lymphocytes Absolute: 2.1 10*3/uL (ref 0.7–3.1)
MCH: 29.1 pg (ref 26.6–33.0)
MCHC: 32.9 g/dL (ref 31.5–35.7)
MCV: 88 fL (ref 79–97)
MONOCYTES: 4 %
Monocytes Absolute: 0.4 10*3/uL (ref 0.1–0.9)
NEUTROS PCT: 73 %
Neutrophils Absolute: 8.1 10*3/uL — ABNORMAL HIGH (ref 1.4–7.0)
RBC: 4.75 x10E6/uL (ref 3.77–5.28)
RDW: 12.9 % (ref 12.3–15.4)
WBC: 11.2 10*3/uL — AB (ref 3.4–10.8)

## 2018-02-01 LAB — LIPID PANEL WITH LDL/HDL RATIO
CHOLESTEROL TOTAL: 198 mg/dL (ref 100–199)
HDL: 89 mg/dL (ref >39)
LDL CALC: 89 mg/dL (ref 0–99)
LDl/HDL Ratio: 1 ratio (ref 0.0–3.2)
TRIGLYCERIDES: 101 mg/dL (ref 0–149)
VLDL CHOLESTEROL CAL: 20 mg/dL (ref 5–40)

## 2018-02-01 LAB — VITAMIN B12: VITAMIN B 12: 335 pg/mL (ref 232–1245)

## 2018-02-01 LAB — COMPREHENSIVE METABOLIC PANEL
ALBUMIN: 4.4 g/dL (ref 3.5–5.5)
ALT: 13 IU/L (ref 0–32)
AST: 10 IU/L (ref 0–40)
Albumin/Globulin Ratio: 1.6 (ref 1.2–2.2)
Alkaline Phosphatase: 50 IU/L (ref 39–117)
BUN / CREAT RATIO: 18 (ref 9–23)
BUN: 16 mg/dL (ref 6–20)
Bilirubin Total: <0.2 mg/dL (ref 0.0–1.2)
CO2: 25 mmol/L (ref 20–29)
CREATININE: 0.87 mg/dL (ref 0.57–1.00)
Calcium: 9.7 mg/dL (ref 8.7–10.2)
Chloride: 96 mmol/L (ref 96–106)
GFR, EST AFRICAN AMERICAN: 103 mL/min/{1.73_m2} (ref >59)
GFR, EST NON AFRICAN AMERICAN: 89 mL/min/{1.73_m2} (ref >59)
GLUCOSE: 78 mg/dL (ref 65–99)
Globulin, Total: 2.7 g/dL (ref 1.5–4.5)
POTASSIUM: 4.9 mmol/L (ref 3.5–5.2)
SODIUM: 136 mmol/L (ref 134–144)
TOTAL PROTEIN: 7.1 g/dL (ref 6.0–8.5)

## 2018-02-01 LAB — TSH: TSH: 2.61 u[IU]/mL (ref 0.450–4.500)

## 2018-02-01 LAB — VITAMIN D 25 HYDROXY (VIT D DEFICIENCY, FRACTURES): Vit D, 25-Hydroxy: 17.4 ng/mL — ABNORMAL LOW (ref 30.0–100.0)

## 2018-02-01 LAB — HEMOGLOBIN A1C
Est. average glucose Bld gHb Est-mCnc: 100 mg/dL
HEMOGLOBIN A1C: 5.1 % (ref 4.8–5.6)

## 2018-02-01 LAB — T3: T3, Total: 119 ng/dL (ref 71–180)

## 2018-02-01 LAB — INSULIN, RANDOM: INSULIN: 24.5 u[IU]/mL (ref 2.6–24.9)

## 2018-02-01 LAB — T4, FREE: Free T4: 1.28 ng/dL (ref 0.82–1.77)

## 2018-02-01 LAB — FOLATE: Folate: 3.5 ng/mL (ref >3.0)

## 2018-02-05 DIAGNOSIS — F3181 Bipolar II disorder: Secondary | ICD-10-CM | POA: Diagnosis not present

## 2018-02-05 DIAGNOSIS — F4010 Social phobia, unspecified: Secondary | ICD-10-CM | POA: Diagnosis not present

## 2018-02-05 DIAGNOSIS — F4310 Post-traumatic stress disorder, unspecified: Secondary | ICD-10-CM | POA: Diagnosis not present
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
## 2018-02-14 ENCOUNTER — Ambulatory Visit (INDEPENDENT_AMBULATORY_CARE_PROVIDER_SITE_OTHER): Payer: BLUE CROSS/BLUE SHIELD | Admitting: Family Medicine

## 2018-02-14 VITALS — BP 119/81 | HR 73 | Temp 97.6°F | Ht 65.0 in | Wt 193.0 lb

## 2018-02-14 DIAGNOSIS — E559 Vitamin D deficiency, unspecified: Secondary | ICD-10-CM | POA: Diagnosis not present

## 2018-02-14 DIAGNOSIS — F29 Unspecified psychosis not due to a substance or known physiological condition: Secondary | ICD-10-CM | POA: Diagnosis not present

## 2018-02-14 DIAGNOSIS — E661 Drug-induced obesity: Secondary | ICD-10-CM | POA: Insufficient documentation

## 2018-02-14 DIAGNOSIS — E669 Obesity, unspecified: Secondary | ICD-10-CM

## 2018-02-14 DIAGNOSIS — R55 Syncope and collapse: Secondary | ICD-10-CM | POA: Diagnosis not present

## 2018-02-14 DIAGNOSIS — Z6832 Body mass index (BMI) 32.0-32.9, adult: Secondary | ICD-10-CM

## 2018-02-14 DIAGNOSIS — Z9189 Other specified personal risk factors, not elsewhere classified: Secondary | ICD-10-CM

## 2018-02-14 DIAGNOSIS — W19XXXA Unspecified fall, initial encounter: Secondary | ICD-10-CM | POA: Diagnosis not present

## 2018-02-14 DIAGNOSIS — E66811 Obesity, class 1: Secondary | ICD-10-CM | POA: Insufficient documentation

## 2018-02-14 DIAGNOSIS — E8881 Metabolic syndrome: Secondary | ICD-10-CM

## 2018-02-14 MED ORDER — VITAMIN D (ERGOCALCIFEROL) 1.25 MG (50000 UNIT) PO CAPS: 50000.0000 [IU] | ORAL_CAPSULE | ORAL | 0 refills | Status: DC
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
## 2018-03-06 ENCOUNTER — Ambulatory Visit (INDEPENDENT_AMBULATORY_CARE_PROVIDER_SITE_OTHER): Payer: BLUE CROSS/BLUE SHIELD | Admitting: Family Medicine

## 2018-03-06 VITALS — BP 108/74 | HR 88 | Temp 98.4°F | Ht 65.0 in | Wt 192.0 lb

## 2018-03-06 DIAGNOSIS — Z6832 Body mass index (BMI) 32.0-32.9, adult: Secondary | ICD-10-CM | POA: Diagnosis not present

## 2018-03-06 DIAGNOSIS — E8881 Metabolic syndrome: Secondary | ICD-10-CM

## 2018-03-06 DIAGNOSIS — E669 Obesity, unspecified: Secondary | ICD-10-CM | POA: Diagnosis not present
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
## 2018-03-07 DIAGNOSIS — F4010 Social phobia, unspecified: Secondary | ICD-10-CM | POA: Diagnosis not present

## 2018-03-07 DIAGNOSIS — F3181 Bipolar II disorder: Secondary | ICD-10-CM | POA: Diagnosis not present

## 2018-03-07 DIAGNOSIS — F4310 Post-traumatic stress disorder, unspecified: Secondary | ICD-10-CM | POA: Diagnosis not present

## 2018-03-12 ENCOUNTER — Encounter: Payer: Self-pay | Admitting: Family Medicine

## 2018-03-12 ENCOUNTER — Ambulatory Visit: Payer: BLUE CROSS/BLUE SHIELD | Admitting: Family Medicine

## 2018-03-12 VITALS — BP 125/81 | HR 79 | Temp 98.6°F | Resp 16 | Ht 65.0 in | Wt 194.0 lb

## 2018-03-12 DIAGNOSIS — Z01818 Encounter for other preprocedural examination: Secondary | ICD-10-CM | POA: Diagnosis not present

## 2018-03-12 DIAGNOSIS — F640 Transsexualism: Secondary | ICD-10-CM | POA: Diagnosis not present

## 2018-03-12 DIAGNOSIS — Z79899 Other long term (current) drug therapy: Secondary | ICD-10-CM | POA: Diagnosis not present

## 2018-03-12 DIAGNOSIS — F3181 Bipolar II disorder: Secondary | ICD-10-CM

## 2018-03-12 DIAGNOSIS — K5904 Chronic idiopathic constipation: Secondary | ICD-10-CM

## 2018-03-12 DIAGNOSIS — F32A Depression, unspecified: Secondary | ICD-10-CM

## 2018-03-12 DIAGNOSIS — Z789 Other specified health status: Secondary | ICD-10-CM

## 2018-03-12 DIAGNOSIS — F419 Anxiety disorder, unspecified: Secondary | ICD-10-CM

## 2018-03-12 DIAGNOSIS — E6609 Other obesity due to excess calories: Secondary | ICD-10-CM

## 2018-03-12 DIAGNOSIS — Z6832 Body mass index (BMI) 32.0-32.9, adult: Secondary | ICD-10-CM

## 2018-03-12 DIAGNOSIS — F329 Major depressive disorder, single episode, unspecified: Secondary | ICD-10-CM

## 2018-03-12 DIAGNOSIS — F410 Panic disorder [episodic paroxysmal anxiety] without agoraphobia: Secondary | ICD-10-CM

## 2018-03-12 NOTE — Progress Notes (Signed)
Subjective:    Patient ID: Janet Tucker, adult    DOB: 12/08/1985, 32 y.o.   MRN: [id number] Chief Complaint  Patient presents with  . Follow-up    [place] and needs blood work and forms signed.    HPI On Dr. Quincy SheehanBeasely's diet plan and altered her diet plan.  Did get down to 190 lbs - stopped phenteramine when back down to 198 lbs Had cut the estrogen down to 2 pills a day and then stopped  She did go off her estrogen and spironolactone entirely It is a 6 weeks recovery  7-8 Surgery is on the 04/17/2018  Has appt sched w/ [facility] for surgery clearance is on 7/12 since I was going to be out of town. Tremors from the phenteramine Jittery No palpitations.    Review of Systems  Constitutional: Positive for fatigue. Negative for activity change, appetite change, diaphoresis and unexpected weight change.  Respiratory: Negative for chest tightness and shortness of breath.   Cardiovascular: Negative for chest pain and palpitations.  Gastrointestinal: Negative for nausea and vomiting.  Neurological: Negative for dizziness, tremors, syncope and light-headedness.  Psychiatric/Behavioral: Positive for decreased concentration. Negative for agitation, confusion, dysphoric mood, hallucinations, self-injury, sleep disturbance and suicidal ideas. The patient is not nervous/anxious and is not hyperactive.        Objective:   Physical Exam  Constitutional: She is oriented to person, place, and time. She appears well-developed and well-nourished. No distress.  HENT:  Head: Normocephalic and atraumatic.  Right Ear: External ear normal.  Left Ear: External ear normal.  Eyes: Conjunctivae are normal. No scleral icterus.  Neck: Normal range of motion. Neck supple. No thyromegaly present.  Cardiovascular: Normal rate, regular rhythm, normal heart sounds and intact distal pulses.  Pulmonary/Chest: Effort normal and breath sounds normal. No respiratory distress.    Musculoskeletal: She exhibits no edema.  Lymphadenopathy:    She has no cervical adenopathy.  Neurological: She is alert and oriented to person, place, and time.  Skin: Skin is warm and dry. She is not diaphoretic. No erythema.  Psychiatric: She has a normal mood and affect. Her behavior is normal.      BP 125/81   Pulse 79   Temp 98.6 F (37 C) (Oral)   Resp 16   Ht 5\' 5"  (1.651 m)   Wt 194 lb (88 kg)   SpO2 97%   BMI 32.28 kg/m      Assessment & Plan:   1. Class 1 obesity due to excess calories without serious comorbidity with body mass index (BMI) of 32.0 to 32.9 in adult - has to get BMI <30 - so <180 lbs to proceed w/ surgery by the time of her pre-op surgical clearance - otherwise can't heal as well.  She has been on phenteramine for a long time so seems weight loss effects have become immune to that. Seen by [facility] but they are telling her slow and stead is the only way to go and to stop phenteramine which will not get her down by the 15 lbs in the next 2 wks that she needs to proceed with her LONG anticipated [place] so already had flights and hotels booked for her trip there accompanied by a friend for surgery - if she can't get the weight off, they will cancel her surgery and she will loose all this $$ on the booking/non-refundable deposits/late cancellation that she doesn't have -  living had to mouth - husband w/ 2 jobs Discussed meal replacement with smoothies - very low KCal diet - getting lots of veggie based protein in smoothies from added greens and protein powder - for dinner try snacks of unsalted nuts, hard-boiled eggs, raw veggies - ok to add a little bit of fat with a homemade vinegrette w/o sugar. . .   2. Preoperative examination - Can reschedule surgical clearance with me for right after I return from vacation in 2 weeks so I can do it instead of [facility]  3. Female-to-female transgender person - typically we  see weight gain with estradiol increases and suppressed testosterone so lowering her estradiol dose at least - if not her spiro as well - to as low as she can mentally tolerate may help her weight loss attempts to be affective.  4. Encounter for long-term current use of high risk medication   5. Anxiety and depression - following w/ Vernona Rieger at [facility] - encouraged to discuss w/ her if there might be any of her pysch meds that she thinks might be contributing.  6. Panic disorder   7. Bipolar II disorder, most recent episode hypomanic (HCC)   8. Chronic idiopathic constipation     Norberto Sorenson, M.D.  Primary Care at [facility] [address] [place], [place] [address] [phone] phone [phone] fax  03/13/18 8:54 AM

## 2018-03-12 NOTE — Patient Instructions (Addendum)
Green smoothie meal replacement   Whenever you eat fruit, make sure it is the whole fruit (like in a smoothie) so you get fiber of the fruit (unlike in a juice where you just get the sugar of the fruit). When you eat fruit, try to choose more low glycemic index fruits (cherries, grapefruit, apricots, pears, apples, oranges, plums, strawberries, peaches, and grapes are some of the better ones for blood sugar.)   Make sure your constipation is fully treated you can augment with miralax, senokot S  or magnesium citrate which are available over the counter.  Reschedule your appointment for the 7/17 - ok to use a same day appointment.    IF you received an x-ray today, you will receive an invoice from [facility]. Please contact [facility] at [phone] with questions or concerns regarding your invoice.   IF you received labwork today, you will receive an invoice from [facility]. Please contact [facility] at [phone] with questions or concerns regarding your invoice.   Our billing staff will not be able to assist you with questions regarding bills from these companies.  You will be contacted with the lab results as soon as they are available. The fastest way to get your results is to activate your My Chart account. Instructions are located on the last page of this paperwork. If you have not heard from us regarding the results in 2 weeks, please contact this office.

## 2018-03-13 DIAGNOSIS — F4010 Social phobia, unspecified: Secondary | ICD-10-CM | POA: Diagnosis not present

## 2018-03-13 DIAGNOSIS — F4310 Post-traumatic stress disorder, unspecified: Secondary | ICD-10-CM | POA: Diagnosis not present

## 2018-03-13 DIAGNOSIS — F3181 Bipolar II disorder: Secondary | ICD-10-CM | POA: Diagnosis not present

## 2018-03-20 DIAGNOSIS — F4010 Social phobia, unspecified: Secondary | ICD-10-CM | POA: Diagnosis not present

## 2018-03-20 DIAGNOSIS — F3181 Bipolar II disorder: Secondary | ICD-10-CM | POA: Diagnosis not present

## 2018-03-20 DIAGNOSIS — F4310 Post-traumatic stress disorder, unspecified: Secondary | ICD-10-CM | POA: Diagnosis not present

## 2018-03-21 ENCOUNTER — Encounter: Payer: Self-pay | Admitting: Family Medicine

## 2018-03-23 ENCOUNTER — Ambulatory Visit: Payer: BLUE CROSS/BLUE SHIELD | Admitting: Physician Assistant

## 2018-03-28 ENCOUNTER — Ambulatory Visit (INDEPENDENT_AMBULATORY_CARE_PROVIDER_SITE_OTHER): Payer: BLUE CROSS/BLUE SHIELD | Admitting: Family Medicine

## 2018-03-28 ENCOUNTER — Encounter: Payer: Self-pay | Admitting: Family Medicine

## 2018-03-28 VITALS — BP 107/72 | HR 99 | Temp 98.7°F | Resp 17 | Ht 65.0 in | Wt 189.0 lb

## 2018-03-28 DIAGNOSIS — E6609 Other obesity due to excess calories: Secondary | ICD-10-CM | POA: Diagnosis not present

## 2018-03-28 DIAGNOSIS — Z6831 Body mass index (BMI) 31.0-31.9, adult: Secondary | ICD-10-CM

## 2018-03-28 DIAGNOSIS — F4010 Social phobia, unspecified: Secondary | ICD-10-CM | POA: Diagnosis not present

## 2018-03-28 DIAGNOSIS — Z789 Other specified health status: Secondary | ICD-10-CM

## 2018-03-28 DIAGNOSIS — F4310 Post-traumatic stress disorder, unspecified: Secondary | ICD-10-CM | POA: Diagnosis not present

## 2018-03-28 DIAGNOSIS — E8881 Metabolic syndrome: Secondary | ICD-10-CM | POA: Diagnosis not present

## 2018-03-28 DIAGNOSIS — F3181 Bipolar II disorder: Secondary | ICD-10-CM | POA: Diagnosis not present

## 2018-03-28 DIAGNOSIS — K5900 Constipation, unspecified: Secondary | ICD-10-CM | POA: Diagnosis not present

## 2018-03-28 DIAGNOSIS — F640 Transsexualism: Secondary | ICD-10-CM

## 2018-03-28 MED ORDER — POLYETHYLENE GLYCOL 3350 17 GM/SCOOP PO POWD: 34.0000 g | Freq: Every day | ORAL | 1 refills | Status: DC

## 2018-03-28 NOTE — Patient Instructions (Addendum)
FOcus on maintaining your weight for the next month.  Use 1 a day on the phenteramine or less - if you can give your body a little break to decrease your tolerance. After next month - then do another 2 week round of the liquid diet going back up to 2.5 phenteramine a day.  Look up information online about LOW DOSE NALTREXONE (LDN) to see if you would like to give that a try.  Cancel your nutrition appointment.   Weigh yourself every day.  Keep a food diary - Start logging anything you eat or drink. Why? Research shows that those who keep daily food records can lose up to twice as much weight as those who do not.  How? You can use an app like My Fitness Pal, the LoseIt! App to help you find the calories and fat grams in foods (we'll talk more on this next week). "You nibble it, you scribble it!" "You bite it, you write it!" "You drink it, you ink it!"    IF you received an x-ray today, you will receive an invoice from [facility]. Please contact [facility] at [phone] with questions or concerns regarding your invoice.   IF you received labwork today, you will receive an invoice from [facility]. Please contact [facility] at [phone] with questions or concerns regarding your invoice.   Our billing staff will not be able to assist you with questions regarding bills from these companies.  You will be contacted with the lab results as soon as they are available. The fastest way to get your results is to activate your My Chart account. Instructions are located on the last page of this paperwork. If you have not heard from [place] regarding the results in 2 weeks, please contact this office.      Exercising to Lose Weight Exercising can help you to lose weight. In order to lose weight through exercise, you need to do vigorous-intensity exercise. You can tell that you are exercising with vigorous intensity if you are breathing very hard and fast and cannot hold a conversation  while exercising. Moderate-intensity exercise helps to maintain your current weight. You can tell that you are exercising at a moderate level if you have a higher heart rate and faster breathing, but you are still able to hold a conversation. How often should I exercise? Choose an activity that you enjoy and set realistic goals. Your health care provider can help you to make an activity plan that works for you. Exercise regularly as directed by your health care provider. This may include:  Doing resistance training twice each week, such as: ? Push-ups. ? Sit-ups. ? Lifting weights. ? Using resistance bands.  Doing a given intensity of exercise for a given amount of time. Choose from these options: ? 150 minutes of moderate-intensity exercise every week. ? 75 minutes of vigorous-intensity exercise every week. ? A mix of moderate-intensity and vigorous-intensity exercise every week.  Children, pregnant women, people who are out of shape, people who are overweight, and older adults may need to consult a health care provider for individual recommendations. If you have any sort of medical condition, be sure to consult your health care provider before starting a new exercise program. What are some activities that can help me to lose weight?  Walking at a rate of at least 4.5 miles an hour.  Jogging or running at a rate of 5 miles per hour.  Biking at a rate of at least 10 miles per hour.  Lap swimming.  Roller-skating or in-line skating.  Cross-country skiing.  Vigorous competitive sports, such as football, basketball, and soccer.  Jumping rope.  Aerobic dancing. How can I be more active in my day-to-day activities?  Use the stairs instead of the elevator.  Take a walk during your lunch break.  If you drive, park your car farther away from work or school.  If you take public transportation, get off one stop early and walk the rest of the way.  Make all of your phone calls  while standing up and walking around.  Get up, stretch, and walk around every 30 minutes throughout the day. What guidelines should I follow while exercising?  Do not exercise so much that you hurt yourself, feel dizzy, or get very short of breath.  Consult your health care provider prior to starting a new exercise program.  Wear comfortable clothes and shoes with good support.  Drink plenty of water while you exercise to prevent dehydration or heat stroke. Body water is lost during exercise and must be replaced.  Work out until you breathe faster and your heart beats faster. This information is not intended to replace advice given to you by your health care provider. Make sure you discuss any questions you have with your health care provider. Document Released: 10/01/2010 Document Revised: 02/04/2016 Document Reviewed: 01/30/2014 Elsevier Interactive Patient Education  2018 [employer]  Preventing Complications From Unhealthy Eating Behaviors, Adult Eating behaviors are influenced by many social, emotional, and psychological factors. Everyone has some unhealthy eating behaviors. These could be eating too much or too little, eating unhealthy foods, or eating at the wrong times. If you also struggle with weight management, it may be even harder to change patterns of irregular and unhealthy eating (disordered eating). Being overweight and having unhealthy eating behaviors may lead to dangerous health problems. Unhealthy eating behaviors may also be signs that you have a type of mental health issue that causes problems with healthy eating or weight regulation (eating disorder). What nutrition changes can be made? You can start changing unhealthy eating behaviors by making different food choices. Choices that you make about what to eat and drink are very important, especially if you want to lose weight. What to avoid:  Foods that contain a lot of fat, salt (sodium), or sugar. These include  candy, donuts, pizza, and fast foods.  Fried or heavily processed foods.  Drinks that contain a lot of sugar. Healthy behaviors:   Eat a variety of healthy foods, including: ? Fruits and vegetables. ? Whole grains. ? Lean proteins. ? Low-fat dairy products.  Drink water instead of sugary drinks.  Plan healthy, low-calorie meals. Work with a Dealernutrition specialist (dietitian) to make a healthy meal plan that works for you. What lifestyle changes can be made? You can also make certain lifestyle changes to help you change unhealthy eating behaviors. What to avoid:  Eating when you are: ? Not hungry. ? Bored. ? Stressed. ? Doing another activity, like watching television.  Eating late at night.  Following a diet that restricts entire types of food.  Skipping meals to save calories. It is especially important to eat breakfast.  Not eating anything for long periods of time (fasting).  Restricting your calories to far less than the amount that you need to lose or maintain a healthy weight.  Compulsively getting an extreme amount of exercise.  Eating an excessive amount of food (bingeing), then making yourself vomit (purging). Healthy behaviors:   Keep a  food diary to help you see patterns of unhealthy eating behaviors and what triggers them.  Work with your health care provider or a dietitian to design an exercise program that works for you. ? To maintain your weight, get at least 150 minutes of moderate-intensity exercise every week. Moderate-intensity exercise could be brisk walking or biking. ? To lose a healthy amount of weight, get 60 minutes of moderate-intensity exercise each day.  Plan your meals ahead of time and prepare them at home.  Find ways to reduce stress, such as regular exercise or meditation.  Find a hobby or other activity that you enjoy to distract you from eating when you feel stressed or bored.  Eat your food slowly, and avoid distractions such as  watching TV while you eat.  Get enough sleep each night.  Give yourself time to replace unhealthy eating behaviors with healthy ones. Why are these changes important? Making these changes will improve your overall health. Maintaining a healthy weight also lowers your risk of certain conditions, including:  Heart disease.  High cholesterol.  High blood pressure.  Type 2 diabetes.  Stroke.  Osteoarthritis.  Osteoporosis.  Some types of cancer.  Breathing and sleeping disorders.  What can happen if changes are not made? You could develop health problems if you do not make these changes. Unhealthy eating behaviors can also cause you to be overweight or obese, which can increase your risk of:  Heart disease.  High blood pressure.  Type 2 diabetes.  Some types of cancers.  Using disordered eating or other unhealthy eating behaviors to try to lose weight can cause:  Fatigue.  Gastrointestinal problems.  Dehydration.  Imbalances in body fluids.  Low heart rate and blood pressure.  Thin bones that break easily.  Social isolation or relationship problems with your friends and family.  Emotional distress, including depression and anxiety.  A greater risk of an eating disorder.  If you develop an eating disorder, you could develop serious health problems and complications that affect yourorgans and bodily processes. These include:  Dry skin and hair.  Hair loss.  Fainting.  Difficulty getting pregnant.  Changes in your heart muscle and the way your heart works.  Severe dehydration that can lead to kidney failure.  Long-term (chronic) gastrointestinal problems.  High blood pressure.  High cholesterol.  Heart disease.  Type 2 diabetes.  Where to find support: For more support, talk with:  Your health care provider or dietitian. Ask about support groups.  A mental health care provider.  Family and friends.  Where to find more  information:  Learn more about how to prevent complications from unhealthy eating behaviors from:  https://www.bernard.org/: https://ball-collins.biz/  Centers for Disease Control and Prevention: VisitYa.tn.html  [employer] of Mental Health: InsuranceStats.ca.shtml  National Eating Disorders Association: www.nationaleatingdisorders.org  Contact a health care provider if:  You often feel very tired.  You notice changes in your skin or your hair.  You faint because you have not eaten enough.  You struggle to change your unhealthy eating behaviors on your own.  Unhealthy eating behaviors are affecting your daily life.  You have signs or symptoms of an eating disorder.  You have major weight changes in a short period of time.  You feel guilty or ashamed about eating.  You have trouble with your relationships because of your eating habits. Summary  Unhealthy eating behaviors could be eating too much or too little, eating unhealthy foods, or eating at the wrong times.  You  can improve your eating habits and help prevent health problems by choosing healthy foods, getting enough calories every day, and exercising regularly.  If you cannot make these changes by yourself, or if you think that you may have an eating disorder, contact your health care provider. This information is not intended to replace advice given to you by your health care provider. Make sure you discuss any questions you have with your health care provider. Document Released: 09/13/2015 Document Revised: 03/18/2016 Document Reviewed: 09/13/2015 Elsevier Interactive Patient Education  2018 [employer].
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
## 2018-03-29 ENCOUNTER — Telehealth: Payer: Self-pay | Admitting: Family Medicine

## 2018-03-29 NOTE — Telephone Encounter (Signed)
Copied from CRM [phone] [id number]. Topic: General - Other >> Mar 28, 2018  6:32 PM Debroah LoopLander, Lumin L wrote: Reason for CRM: Patient was not given a note today to be written out for today and yesterday 07/16. Please advise pt when the note is ready to be picked up.

## 2018-04-02 DIAGNOSIS — F419 Anxiety disorder, unspecified: Secondary | ICD-10-CM | POA: Diagnosis not present

## 2018-04-03 NOTE — Telephone Encounter (Signed)
Patient is returning call regarding note that she needs for her previous visits.  Please advise and let patient know when she can pick up the note.  CB# [phone]

## 2018-04-04 ENCOUNTER — Encounter (INDEPENDENT_AMBULATORY_CARE_PROVIDER_SITE_OTHER): Payer: Self-pay

## 2018-04-04 ENCOUNTER — Ambulatory Visit (INDEPENDENT_AMBULATORY_CARE_PROVIDER_SITE_OTHER): Payer: BLUE CROSS/BLUE SHIELD | Admitting: Family Medicine

## 2018-04-04 NOTE — Telephone Encounter (Signed)
Letter printed signed and pt notified it will be at front desk for her husband to pick up (per pt)

## 2018-04-05 DIAGNOSIS — F3181 Bipolar II disorder: Secondary | ICD-10-CM | POA: Diagnosis not present

## 2018-04-05 DIAGNOSIS — F4010 Social phobia, unspecified: Secondary | ICD-10-CM | POA: Diagnosis not present

## 2018-04-05 DIAGNOSIS — F4310 Post-traumatic stress disorder, unspecified: Secondary | ICD-10-CM | POA: Diagnosis not present

## 2018-04-09 ENCOUNTER — Other Ambulatory Visit: Payer: Self-pay

## 2018-04-09 ENCOUNTER — Encounter: Payer: Self-pay | Admitting: Family Medicine

## 2018-04-09 ENCOUNTER — Ambulatory Visit: Payer: BLUE CROSS/BLUE SHIELD | Admitting: Family Medicine

## 2018-04-09 ENCOUNTER — Ambulatory Visit: Payer: Self-pay

## 2018-04-09 VITALS — BP 112/70 | HR 78 | Temp 98.0°F | Resp 16 | Ht 65.75 in | Wt 192.0 lb

## 2018-04-09 DIAGNOSIS — F640 Transsexualism: Secondary | ICD-10-CM

## 2018-04-09 DIAGNOSIS — F410 Panic disorder [episodic paroxysmal anxiety] without agoraphobia: Secondary | ICD-10-CM

## 2018-04-09 DIAGNOSIS — Z789 Other specified health status: Secondary | ICD-10-CM

## 2018-04-09 DIAGNOSIS — Z6832 Body mass index (BMI) 32.0-32.9, adult: Secondary | ICD-10-CM

## 2018-04-09 DIAGNOSIS — R42 Dizziness and giddiness: Secondary | ICD-10-CM

## 2018-04-09 DIAGNOSIS — E661 Drug-induced obesity: Secondary | ICD-10-CM

## 2018-04-09 DIAGNOSIS — F32A Depression, unspecified: Secondary | ICD-10-CM

## 2018-04-09 DIAGNOSIS — R55 Syncope and collapse: Secondary | ICD-10-CM | POA: Diagnosis not present

## 2018-04-09 DIAGNOSIS — D72825 Bandemia: Secondary | ICD-10-CM | POA: Diagnosis not present

## 2018-04-09 DIAGNOSIS — F3181 Bipolar II disorder: Secondary | ICD-10-CM

## 2018-04-09 DIAGNOSIS — F329 Major depressive disorder, single episode, unspecified: Secondary | ICD-10-CM

## 2018-04-09 DIAGNOSIS — Z01818 Encounter for other preprocedural examination: Secondary | ICD-10-CM

## 2018-04-09 DIAGNOSIS — E66811 Obesity, class 1: Secondary | ICD-10-CM

## 2018-04-09 DIAGNOSIS — F419 Anxiety disorder, unspecified: Secondary | ICD-10-CM

## 2018-04-09 LAB — POC MICROSCOPIC URINALYSIS (UMFC): Mucus: ABSENT

## 2018-04-09 LAB — POCT CBC
Granulocyte percent: 69.9 %G (ref 37–80)
HCT, POC: 42.2 % (ref 37.7–47.9)
HEMOGLOBIN: 13.2 g/dL (ref 12.2–16.2)
LYMPH, POC: 3.2 (ref 0.6–3.4)
MCH: 27 pg (ref 27–31.2)
MCHC: 31.2 g/dL — AB (ref 31.8–35.4)
MCV: 86.7 fL (ref 80–97)
MID (cbc): 1.1 — AB (ref 0–0.9)
MPV: 7.6 fL (ref 0–99.8)
PLATELET COUNT, POC: 464 10*3/uL — AB (ref 142–424)
POC Granulocyte: 9.9 — AB (ref 2–6.9)
POC LYMPH PERCENT: 22.4 %L (ref 10–50)
POC MID %: 7.7 %M (ref 0–12)
RBC: 4.87 M/uL (ref 4.04–5.48)
RDW, POC: 13.4 %
WBC: 14.1 10*3/uL — AB (ref 4.6–10.2)

## 2018-04-09 LAB — POCT URINALYSIS DIP (MANUAL ENTRY)
BILIRUBIN UA: NEGATIVE
Glucose, UA: NEGATIVE mg/dL
Ketones, POC UA: NEGATIVE mg/dL
LEUKOCYTES UA: NEGATIVE
NITRITE UA: NEGATIVE
PH UA: 7 (ref 5.0–8.0)
PROTEIN UA: NEGATIVE mg/dL
RBC UA: NEGATIVE
Spec Grav, UA: 1.01 (ref 1.010–1.025)
UROBILINOGEN UA: 0.2 U/dL

## 2018-04-09 MED ORDER — ALPRAZOLAM 0.5 MG PO TBDP: 0.5000 mg | ORAL_TABLET | ORAL | 0 refills | Status: DC | PRN

## 2018-04-09 NOTE — Patient Instructions (Addendum)
     IF you received an x-ray today, you will receive an invoice from [facility]. Please contact [facility] at [phone] with questions or concerns regarding your invoice.   IF you received labwork today, you will receive an invoice from [facility]. Please contact [facility] at [phone] with questions or concerns regarding your invoice.   Our billing staff will not be able to assist you with questions regarding bills from these companies.  You will be contacted with the lab results as soon as they are available. The fastest way to get your results is to activate your My Chart account. Instructions are located on the last page of this paperwork. If you have not heard from us regarding the results in 2 weeks, please contact this office.      Syncope Syncope is when you temporarily lose consciousness. Syncope may also be called fainting or passing out. It is caused by a sudden decrease in blood flow to the brain. Even though most causes of syncope are not dangerous, syncope can be a sign of a serious medical problem. Signs that you may be about to faint include:  Feeling dizzy or light-headed.  Feeling nauseous.  Seeing all white or all black in your field of vision.  Having cold, clammy skin.  If you fainted, get medical help right away.Call your local emergency services (911 in the [place] Do not drive yourself to the hospital. Follow these instructions at home: Pay attention to any changes in your symptoms. Take these actions to help with your condition:  Have someone stay with you until you feel stable.  Do not drive, use machinery, or play sports until your health care provider says it is okay.  Keep all follow-up visits as told by your health care provider. This is important.  If you start to feel like you might faint, lie down right away and raise (elevate) your feet above the level of your heart. Breathe deeply and steadily. Wait until all of the symptoms  have passed.  Drink enough fluid to keep your urine clear or pale yellow.  If you are taking blood pressure or heart medicine, get up slowly and take several minutes to sit and then stand. This can reduce dizziness.  Take over-the-counter and prescription medicines only as told by your health care provider.  Get help right away if:  You have a severe headache.  You have unusual pain in your chest, abdomen, or back.  You are bleeding from your mouth or rectum, or you have black or tarry stool.  You have a very fast or irregular heartbeat (palpitations).  You have pain with breathing.  You faint once or repeatedly.  You have a seizure.  You are confused.  You have trouble walking.  You have severe weakness.  You have vision problems. These symptoms may represent a serious problem that is an emergency. Do not wait to see if your symptoms will go away. Get medical help right away. Call your local emergency services (911 in the [place] Do not drive yourself to the hospital. This information is not intended to replace advice given to you by your health care provider. Make sure you discuss any questions you have with your health care provider. Document Released: 08/29/2005 Document Revised: 02/04/2016 Document Reviewed: 05/13/2015 Elsevier Interactive Patient Education  [employer]2018 [employer] Inc[employer]

## 2018-04-09 NOTE — Telephone Encounter (Signed)
Patient called in with c/o "dizziness." She says "I passed out at work last Monday and EMS was called. Everything checked out, they said I was a little dehydrated because they couldn't get blood out of my hand initially. I have been feeling more anxiety and when that happens, I pass out. I had another episode and hit my head, EMS wasn't called. I woke up Saturday with a headache. This has been happening off and on all last week and I need to be evaluated to see what can be done for the dizziness." I asked about other symptoms besides anxiety, she denies. According to protocol, see PCP today. The only slot is a Same Day at 1740, I [facility] Burnard [facility]renda Campbell[facility] who says to go ahead and schedule the patient today at 1740. Patient advised of appointment with Dr. Clelia CroftShaw today, care advice given, patient verbalized understanding.   Reason for Disposition . [1] MODERATE dizziness (e.g., interferes with normal activities) AND [2] has NOT been evaluated by physician for this  (Exception: dizziness caused by heat exposure, sudden standing, or poor fluid intake)  Answer Assessment - Initial Assessment Questions 1. DESCRIPTION: "Describe your dizziness."     Lightheaded 2. LIGHTHEADED: "Do you feel lightheaded?" (e.g., somewhat faint, woozy, weak upon standing)     Yes 3. VERTIGO: "Do you feel like either you or the room is spinning or tilting?" (i.e. vertigo)     Yes 4. SEVERITY: "How bad is it?"  "Do you feel like you are going to faint?" "Can you stand and walk?"   - MILD - walking normally   - MODERATE - interferes with normal activities (e.g., work, school)    - SEVERE - unable to stand, requires support to walk, feels like passing out now.      Moderate 5. ONSET:  "When did the dizziness begin?"    On and off since Monday last week 6. AGGRAVATING FACTORS: "Does anything make it worse?" (e.g., standing, change in head position)     Anxiety, standing 7. HEART RATE: "Can you tell me your heart rate?" "How  many beats in 15 seconds?"  (Note: not all patients can do this)       No 8. CAUSE: "What do you think is causing the dizziness?"     I don't know, but I think it's anxiety 9. RECURRENT SYMPTOM: "Have you had dizziness before?" If so, ask: "When was the last time?" "What happened that time?"     Yes 10. OTHER SYMPTOMS: "Do you have any other symptoms?" (e.g., fever, chest pain, vomiting, diarrhea, bleeding)       Anxiety 11. PREGNANCY: "Is there any chance you are pregnant?" "When was your last menstrual period?"       N/A  Protocols used: DIZZINESS [facility]- LIGHTHEADEDNESS-A-AH
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
## 2018-04-10 LAB — COMPREHENSIVE METABOLIC PANEL
A/G RATIO: 1.8 (ref 1.2–2.2)
ALBUMIN: 4.7 g/dL (ref 3.5–5.5)
ALK PHOS: 52 IU/L (ref 39–117)
ALT: 22 IU/L (ref 0–32)
AST: 17 IU/L (ref 0–40)
BUN / CREAT RATIO: 11 (ref 9–23)
BUN: 10 mg/dL (ref 6–20)
CHLORIDE: 97 mmol/L (ref 96–106)
CO2: 18 mmol/L — ABNORMAL LOW (ref 20–29)
Calcium: 9.5 mg/dL (ref 8.7–10.2)
Creatinine, Ser: 0.94 mg/dL (ref 0.57–1.00)
GFR calc non Af Amer: 81 mL/min/{1.73_m2} (ref >59)
GFR, EST AFRICAN AMERICAN: 93 mL/min/{1.73_m2} (ref >59)
Globulin, Total: 2.6 g/dL (ref 1.5–4.5)
Glucose: 89 mg/dL (ref 65–99)
POTASSIUM: 4.3 mmol/L (ref 3.5–5.2)
Sodium: 134 mmol/L (ref 134–144)
TOTAL PROTEIN: 7.3 g/dL (ref 6.0–8.5)

## 2018-04-10 LAB — SPECIMEN STATUS REPORT

## 2018-04-10 LAB — SEDIMENTATION RATE: SED RATE: 2 mm/h (ref 0–32)

## 2018-04-10 LAB — C-REACTIVE PROTEIN: CRP: 2 mg/L (ref 0–10)

## 2018-04-11 ENCOUNTER — Ambulatory Visit (INDEPENDENT_AMBULATORY_CARE_PROVIDER_SITE_OTHER): Payer: BLUE CROSS/BLUE SHIELD

## 2018-04-11 DIAGNOSIS — R42 Dizziness and giddiness: Secondary | ICD-10-CM | POA: Diagnosis not present

## 2018-04-11 DIAGNOSIS — R55 Syncope and collapse: Secondary | ICD-10-CM | POA: Diagnosis not present

## 2018-04-12 ENCOUNTER — Ambulatory Visit: Payer: BLUE CROSS/BLUE SHIELD | Admitting: Family Medicine

## 2018-04-12 ENCOUNTER — Telehealth: Payer: Self-pay

## 2018-04-12 NOTE — Telephone Encounter (Signed)
Copied from CRM [phone]. Topic: Appointment Scheduling - Scheduling Inquiry for Clinic >> Apr 12, 2018  7:46 AM Oneal GroutSebastian, Jennifer S wrote: Reason for CRM: Patient had to cancel appt today due to anxiety, requesting to be seen tomorrow. Able to work in?  Message sent to scheduling.

## 2018-04-13 NOTE — Telephone Encounter (Signed)
Patient checking status.

## 2018-04-13 NOTE — Telephone Encounter (Signed)
Called and spoke with pt. So she can setup an appt. With Dr. Clelia CroftShaw

## 2018-04-17 DIAGNOSIS — F4010 Social phobia, unspecified: Secondary | ICD-10-CM | POA: Diagnosis not present

## 2018-04-17 DIAGNOSIS — F3181 Bipolar II disorder: Secondary | ICD-10-CM | POA: Diagnosis not present

## 2018-04-17 DIAGNOSIS — F4310 Post-traumatic stress disorder, unspecified: Secondary | ICD-10-CM | POA: Diagnosis not present

## 2018-04-19 ENCOUNTER — Encounter: Payer: Self-pay | Admitting: Family Medicine

## 2018-04-19 ENCOUNTER — Other Ambulatory Visit: Payer: Self-pay

## 2018-04-19 ENCOUNTER — Ambulatory Visit (INDEPENDENT_AMBULATORY_CARE_PROVIDER_SITE_OTHER): Payer: BLUE CROSS/BLUE SHIELD | Admitting: Family Medicine

## 2018-04-19 VITALS — BP 108/80 | HR 91 | Temp 98.0°F | Resp 16 | Ht 66.14 in | Wt 188.0 lb

## 2018-04-19 DIAGNOSIS — F329 Major depressive disorder, single episode, unspecified: Secondary | ICD-10-CM

## 2018-04-19 DIAGNOSIS — F419 Anxiety disorder, unspecified: Secondary | ICD-10-CM

## 2018-04-19 DIAGNOSIS — F3181 Bipolar II disorder: Secondary | ICD-10-CM

## 2018-04-19 DIAGNOSIS — D72829 Elevated white blood cell count, unspecified: Secondary | ICD-10-CM

## 2018-04-19 DIAGNOSIS — K5904 Chronic idiopathic constipation: Secondary | ICD-10-CM

## 2018-04-19 DIAGNOSIS — F32A Depression, unspecified: Secondary | ICD-10-CM

## 2018-04-19 DIAGNOSIS — F4310 Post-traumatic stress disorder, unspecified: Secondary | ICD-10-CM

## 2018-04-19 DIAGNOSIS — F410 Panic disorder [episodic paroxysmal anxiety] without agoraphobia: Secondary | ICD-10-CM

## 2018-04-19 DIAGNOSIS — F488 Other specified nonpsychotic mental disorders: Secondary | ICD-10-CM

## 2018-04-19 DIAGNOSIS — R55 Syncope and collapse: Secondary | ICD-10-CM

## 2018-04-19 LAB — POCT URINALYSIS DIP (MANUAL ENTRY)
Bilirubin, UA: NEGATIVE
Blood, UA: NEGATIVE
GLUCOSE UA: NEGATIVE mg/dL
Ketones, POC UA: NEGATIVE mg/dL
Leukocytes, UA: NEGATIVE
NITRITE UA: NEGATIVE
Protein Ur, POC: NEGATIVE mg/dL
Spec Grav, UA: 1.015 (ref 1.010–1.025)
UROBILINOGEN UA: 0.2 U/dL
pH, UA: 7 (ref 5.0–8.0)

## 2018-04-19 LAB — POCT CBC
GRANULOCYTE PERCENT: 76 % (ref 37–80)
HCT, POC: 44.7 % (ref 37.7–47.9)
Hemoglobin: 14.6 g/dL (ref 12.2–16.2)
LYMPH, POC: 2.6 (ref 0.6–3.4)
MCH, POC: 28.5 pg (ref 27–31.2)
MCHC: 32.7 g/dL (ref 31.8–35.4)
MCV: 87.1 fL (ref 80–97)
MID (CBC): 0.3 (ref 0–0.9)
MPV: 7.7 fL (ref 0–99.8)
PLATELET COUNT, POC: 447 10*3/uL — AB (ref 142–424)
POC Granulocyte: 9.2 — AB (ref 2–6.9)
POC LYMPH %: 21.3 % (ref 10–50)
POC MID %: 2.7 %M (ref 0–12)
RBC: 5.13 M/uL (ref 4.04–5.48)
RDW, POC: 13.3 %
WBC: 12.1 10*3/uL — AB (ref 4.6–10.2)

## 2018-04-19 MED ORDER — LUBIPROSTONE 24 MCG PO CAPS: 24.0000 ug | ORAL_CAPSULE | Freq: Two times a day (BID) | ORAL | 11 refills | Status: DC

## 2018-04-19 MED ORDER — LACTULOSE 10 GM/15ML PO SOLN: 10.0000 g | Freq: Two times a day (BID) | ORAL | 0 refills | Status: DC | PRN
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
## 2018-04-19 NOTE — Patient Instructions (Signed)
     IF you received an x-ray today, you will receive an invoice from [facility]. Please contact [facility] at [phone] with questions or concerns regarding your invoice.   IF you received labwork today, you will receive an invoice from [facility]. Please contact [facility] at [phone] with questions or concerns regarding your invoice.   Our billing staff will not be able to assist you with questions regarding bills from these companies.  You will be contacted with the lab results as soon as they are available. The fastest way to get your results is to activate your My Chart account. Instructions are located on the last page of this paperwork. If you have not heard from us regarding the results in 2 weeks, please contact this office.     

## 2018-04-26 DIAGNOSIS — F4310 Post-traumatic stress disorder, unspecified: Secondary | ICD-10-CM | POA: Diagnosis not present

## 2018-04-26 DIAGNOSIS — F3181 Bipolar II disorder: Secondary | ICD-10-CM | POA: Diagnosis not present

## 2018-04-26 DIAGNOSIS — F4010 Social phobia, unspecified: Secondary | ICD-10-CM | POA: Diagnosis not present

## 2018-04-27 ENCOUNTER — Other Ambulatory Visit: Payer: Self-pay | Admitting: Family Medicine

## 2018-04-30 NOTE — Telephone Encounter (Signed)
Copied from CRM [phone]. Topic: Quick Communication - Rx Refill/Question >> Apr 30, 2018 12:01 PM Jay SchlichterWeikart, Melissa J wrote: Medication:estradiol (ESTRACE) 2 MG tablet  Has the patient contacted their pharmacy? Yes.   (Agent: If no, request that the patient contact the pharmacy for the refill.) (Agent: If yes, when and what did the pharmacy advise?)  Preferred Pharmacy (with phone number or street name): [id number] - [place], [place] - [address] [facility] [address] Pt says she has been out for 4 days   Agent: Please be advised that RX refills may take up to 3 business days. We ask that you follow-up with your pharmacy.

## 2018-05-01 ENCOUNTER — Telehealth: Payer: Self-pay | Admitting: Family Medicine

## 2018-05-01 DIAGNOSIS — F4310 Post-traumatic stress disorder, unspecified: Secondary | ICD-10-CM | POA: Diagnosis not present

## 2018-05-01 DIAGNOSIS — F3181 Bipolar II disorder: Secondary | ICD-10-CM | POA: Diagnosis not present

## 2018-05-01 DIAGNOSIS — F4010 Social phobia, unspecified: Secondary | ICD-10-CM | POA: Diagnosis not present

## 2018-05-01 NOTE — Telephone Encounter (Signed)
Please advise 

## 2018-05-01 NOTE — Telephone Encounter (Signed)
Copied from CRM [id number]. Topic: Inquiry >> May 01, 2018 10:56 AM Crist InfanteHarrald, Kathy J wrote: Reason for CRM: pt needs a return to work note today stating she can return to work tomorrow. All needs to say is she is released and able to work on [employer]. Pt aware Dr Clelia CroftShaw is out, but need to pick up today and hopes someone else can write note for her.

## 2018-05-02 NOTE — Telephone Encounter (Signed)
Pt calling to check the status of this request. °

## 2018-05-02 NOTE — Telephone Encounter (Signed)
Pt calling to check status on receiving note for work.

## 2018-05-03 ENCOUNTER — Encounter: Payer: Self-pay | Admitting: *Deleted

## 2018-05-03 DIAGNOSIS — F4310 Post-traumatic stress disorder, unspecified: Secondary | ICD-10-CM | POA: Diagnosis not present

## 2018-05-03 DIAGNOSIS — F4010 Social phobia, unspecified: Secondary | ICD-10-CM | POA: Diagnosis not present

## 2018-05-03 DIAGNOSIS — F3181 Bipolar II disorder: Secondary | ICD-10-CM | POA: Diagnosis not present

## 2018-05-09 ENCOUNTER — Other Ambulatory Visit: Payer: Self-pay

## 2018-05-09 ENCOUNTER — Encounter: Payer: Self-pay | Admitting: Family Medicine

## 2018-05-09 ENCOUNTER — Ambulatory Visit: Payer: BLUE CROSS/BLUE SHIELD | Admitting: Family Medicine

## 2018-05-09 VITALS — BP 108/70 | HR 108 | Temp 98.0°F | Resp 16 | Ht 66.54 in | Wt 191.0 lb

## 2018-05-09 DIAGNOSIS — F488 Other specified nonpsychotic mental disorders: Secondary | ICD-10-CM

## 2018-05-09 DIAGNOSIS — Z789 Other specified health status: Secondary | ICD-10-CM

## 2018-05-09 DIAGNOSIS — E6609 Other obesity due to excess calories: Secondary | ICD-10-CM | POA: Diagnosis not present

## 2018-05-09 DIAGNOSIS — F3181 Bipolar II disorder: Secondary | ICD-10-CM

## 2018-05-09 DIAGNOSIS — Z01818 Encounter for other preprocedural examination: Secondary | ICD-10-CM

## 2018-05-09 DIAGNOSIS — E66811 Obesity, class 1: Secondary | ICD-10-CM

## 2018-05-09 DIAGNOSIS — F640 Transsexualism: Secondary | ICD-10-CM | POA: Diagnosis not present

## 2018-05-09 DIAGNOSIS — Z6830 Body mass index (BMI) 30.0-30.9, adult: Secondary | ICD-10-CM

## 2018-05-09 MED ORDER — PHENTERMINE HCL 37.5 MG PO TABS: 37.5000 mg | ORAL_TABLET | Freq: Two times a day (BID) | ORAL | 1 refills | Status: DC

## 2018-05-09 NOTE — Patient Instructions (Signed)
° ° ° °  If you have lab work done today you will be contacted with your lab results within the next 2 weeks.  If you have not heard from us then please contact us. The fastest way to get your results is to register for My Chart. ° ° °IF you received an x-ray today, you will receive an invoice from [facility]. Please contact [facility] at [phone] with questions or concerns regarding your invoice.  ° °IF you received labwork today, you will receive an invoice from [facility]. Please contact [facility] at [phone] with questions or concerns regarding your invoice.  ° °Our billing staff will not be able to assist you with questions regarding bills from these companies. ° °You will be contacted with the lab results as soon as they are available. The fastest way to get your results is to activate your My Chart account. Instructions are located on the last page of this paperwork. If you have not heard from us regarding the results in 2 weeks, please contact this office. °  ° ° ° °
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
## 2018-05-10 DIAGNOSIS — F4010 Social phobia, unspecified: Secondary | ICD-10-CM | POA: Diagnosis not present

## 2018-05-10 DIAGNOSIS — F4310 Post-traumatic stress disorder, unspecified: Secondary | ICD-10-CM | POA: Diagnosis not present

## 2018-05-10 DIAGNOSIS — F3181 Bipolar II disorder: Secondary | ICD-10-CM | POA: Diagnosis not present

## 2018-05-14 DIAGNOSIS — F4010 Social phobia, unspecified: Secondary | ICD-10-CM | POA: Diagnosis not present

## 2018-05-14 DIAGNOSIS — F4310 Post-traumatic stress disorder, unspecified: Secondary | ICD-10-CM | POA: Diagnosis not present

## 2018-05-14 DIAGNOSIS — F3181 Bipolar II disorder: Secondary | ICD-10-CM | POA: Diagnosis not present

## 2018-05-15 ENCOUNTER — Telehealth: Payer: Self-pay | Admitting: Family Medicine

## 2018-05-15 NOTE — Telephone Encounter (Signed)
Called pt. To inform them of Dr. Alver Fisher leave of absence. Left Vm per Dpr

## 2018-05-16 NOTE — Telephone Encounter (Signed)
**  Patient needs to reschedule their apt on 05/21/18 with a different provider or get an appointment with Dr Shaw when she comes back in Oct. If she only has same day slots please call the clinic and ask for Caitlin so I can open a slot for you.**CC °

## 2018-05-21 ENCOUNTER — Ambulatory Visit: Payer: BLUE CROSS/BLUE SHIELD | Admitting: Physician Assistant

## 2018-05-21 ENCOUNTER — Ambulatory Visit: Payer: BLUE CROSS/BLUE SHIELD | Admitting: Family Medicine

## 2018-05-21 VITALS — BP 116/78 | HR 95 | Temp 98.6°F | Resp 16 | Ht 66.5 in | Wt 187.0 lb

## 2018-05-21 DIAGNOSIS — Z01812 Encounter for preprocedural laboratory examination: Secondary | ICD-10-CM | POA: Diagnosis not present

## 2018-05-21 LAB — POCT URINALYSIS DIP (MANUAL ENTRY)
Blood, UA: NEGATIVE
Glucose, UA: NEGATIVE mg/dL
Leukocytes, UA: NEGATIVE
Nitrite, UA: NEGATIVE
Protein Ur, POC: 30 mg/dL — AB
Spec Grav, UA: 1.025 (ref 1.010–1.025)
Urobilinogen, UA: 1 U/dL
pH, UA: 6 (ref 5.0–8.0)

## 2018-05-21 NOTE — Telephone Encounter (Signed)
Pt arrives to office and I wrote note for her the following day

## 2018-05-21 NOTE — Patient Instructions (Addendum)
   We will contact you with your lab results.  Please give [place] a call on Thursday or Friday morning to check on the status of your fax.   Thank you for coming in today. I hope you feel we met your needs.  Feel free to call PCP if you have any questions or further requests.  Please consider signing up for MyChart if you do not already have it, as this is a great way to communicate with me.  Best,  Whitney McVey, PA-C  IF you received an x-ray today, you will receive an invoice from [facility]. Please contact [facility] at [phone] with questions or concerns regarding your invoice.   IF you received labwork today, you will receive an invoice from [facility]. Please contact [facility] at [phone] with questions or concerns regarding your invoice.   Our billing staff will not be able to assist you with questions regarding bills from these companies.  You will be contacted with the lab results as soon as they are available. The fastest way to get your results is to activate your My Chart account. Instructions are located on the last page of this paperwork. If you have not heard from [place] regarding the results in 2 weeks, please contact this office.

## 2018-05-21 NOTE — Progress Notes (Signed)
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 She plans to have gender transformation surgery next week. Needs labs by this Friday.   1. Do you usually get chest pain or breathlessness when you climb up two flights of stairs at normal speed? no  2. Do you have kidney disease? no  3. Has anyone in your family (blood relatives) had a problem following an anaesthetic? no  4. Have you ever had a heart attack? no  5. Have you ever been diagnosed with an irregular heartbeat? no  6. Have you ever had a stroke? no  7. If you have been put to sleep for an operation were there any anaesthetic problems? no  8. Do you suffer from epilepsy or seizures? no  9. Do you have any problems with pain, stiffness or arthritis in your neck or jaw? no  10. Do you have thyroid disease? no  11. Do you suffer from angina? no  12. Do you have liver disease? no  13. Have you ever been diagnosed with heart failure? no  14. Do you suffer from asthma? Yes. Albuterol PRN  15. Do you have diabetes that requires insulin? no  16. Do you have diabetes that requires tablets only? no  17. Do you suffer from bronchitis? no   Review of Systems  Constitutional: Negative for chills, fatigue and fever.  HENT: Negative for congestion, dental problem and sore throat.   Respiratory: Negative for cough, chest tightness, shortness of breath and wheezing.   Cardiovascular: Negative for chest pain and palpitations.  Musculoskeletal: Negative for neck pain and  neck stiffness.    Patient Active Problem List   Diagnosis Date Noted  . Chronic idiopathic constipation 04/19/2018  . Situational syncope 04/19/2018  . Preoperative testing 04/09/2018  . 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 Vitamin D deficiency 02/14/2018  . 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 Other fatigue 01/31/2018  . Shortness of breath on exertion 01/31/2018  . Vitamin B12 deficiency 01/15/2018  . Female-to-female transgender person 11/14/2017  . Bipolar II disorder, most recent episode hypomanic ([place]) 11/14/2017  . PTSD (post-traumatic stress disorder) 11/14/2017  . Social phobia 11/14/2017  . Panic disorder 11/14/2017  . Anxiety and depression 11/14/2017  . 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 Mild intermittent asthma without complication 97/98/9211  . GERD without esophagitis 02/16/2013    Current Outpatient Medications on File Prior to Visit  Medication Sig Dispense Refill  . ALPRAZolam (NIRAVAM) 0.5 MG dissolvable tablet Take 1 tablet (0.5 mg total) by mouth every 30 (thirty) minutes as needed for anxiety (panic attack). 20 tablet 0  . ARIPiprazole (ABILIFY) 5 MG tablet Take 0.5 tablets (2.5 mg total) by mouth daily. 15 tablet 0  . buPROPion (WELLBUTRIN XL) 150 MG 24 hr tablet   0  . buPROPion (WELLBUTRIN XL) 300 MG 24 hr tablet   0  . clonazePAM (KLONOPIN) 0.5 MG tablet   1  . estradiol (ESTRACE) 2 MG tablet TAKE 1 TABLET BY MOUTH THREE TIMES DAILY 270 tablet 0  .  FLUoxetine (PROZAC) 20 MG capsule Take 20 mg by mouth daily.    Marland Kitchen gabapentin (NEURONTIN) 600 MG tablet Take 600 mg by mouth 2 (two) times daily.    Marland Kitchen lamoTRIgine (LAMICTAL) 100 MG tablet Take 100 mg by mouth daily.    Marland Kitchen lithium carbonate (LITHOBID) 300 MG CR tablet Take 2 tablets (600 mg total) by mouth at bedtime. 60 tablet 0  . lubiprostone (AMITIZA) 24 MCG capsule Take 1 capsule (24 mcg total) by mouth 2 (two) times daily with a  meal. 60 capsule 11  . phentermine (ADIPEX-P) 37.5 MG tablet Take 1 tablet (37.5 mg total) by mouth 2 (two) times daily before a meal. 60 tablet 1  . spironolactone (ALDACTONE) 100 MG tablet Take 100 mg by mouth 2 (two) times daily.    . Vitamin D, Ergocalciferol, (DRISDOL) 50000 units CAPS capsule Take 1 capsule (50,000 Units total) by mouth every 7 (seven) days. 4 capsule 0  . zolpidem (AMBIEN) 5 MG tablet Take 5 mg by mouth at bedtime.     No current facility-administered medications on file prior to visit.     No Known Allergies   Objective:  BP 116/78   Pulse 95   Temp 98.6 F (37 C) (Oral)   Resp 16   Ht 5' 6.5" (1.689 m)   Wt 187 lb (84.8 kg)   SpO2 98%   BMI 29.73 kg/m   Physical Exam  Constitutional: She is oriented to person, place, and time. She appears well-developed and well-nourished.  HENT:  Mouth/Throat: Uvula is midline and oropharynx is clear and moist.  Cardiovascular: Normal rate and regular rhythm.  Pulmonary/Chest: Effort normal. No respiratory distress. She has no wheezes.  Neurological: She is alert and oriented to person, place, and time.  Skin: Skin is warm and dry.  Psychiatric: She has a normal mood and affect. Her behavior is normal. Judgment and thought content normal.  Vitals reviewed.  Results for orders placed or performed in visit on 05/21/18  POCT urinalysis dipstick  Result Value Ref Range   Color, UA yellow yellow   Clarity, UA clear clear   Glucose, UA negative negative mg/dL   Bilirubin, UA moderate (A) negative   Ketones, POC UA large (80) (A) negative mg/dL   Spec Grav, UA 1.025 1.010 - 1.025   Blood, UA negative negative   pH, UA 6.0 5.0 - 8.0   Protein Ur, POC =30 (A) negative mg/dL   Urobilinogen, UA 1.0 0.2 or 1.0 E.U./dL   Nitrite, UA Negative Negative   Leukocytes, UA Negative Negative    Assessment and Plan :  1. Pre-operative laboratory examination - pt presents for pre-op evaluation for gender transformation  surgery next week. She is feeling well today. Fax lab results to [facility] [phone] - CBC with Differential/Platelet - CMP14+EGFR - Protime-INR - POCT urinalysis dipstick - MRSA culture - APTT  Mercer Pod, PA-C  Primary Care at [facility] 05/21/2018 10:54 AM  Please note: Portions of this report may have been transcribed using dragon voice recognition software. Every effort was made to ensure accuracy; however, inadvertent computerized transcription errors may be present.

## 2018-05-22 DIAGNOSIS — F3181 Bipolar II disorder: Secondary | ICD-10-CM | POA: Diagnosis not present

## 2018-05-22 DIAGNOSIS — F4310 Post-traumatic stress disorder, unspecified: Secondary | ICD-10-CM | POA: Diagnosis not present

## 2018-05-22 DIAGNOSIS — F4010 Social phobia, unspecified: Secondary | ICD-10-CM | POA: Diagnosis not present

## 2018-05-22 LAB — CMP14+EGFR
ALT: 15 IU/L (ref 0–32)
AST: 14 IU/L (ref 0–40)
Albumin/Globulin Ratio: 1.9 (ref 1.2–2.2)
Albumin: 4.7 g/dL (ref 3.5–5.5)
Alkaline Phosphatase: 45 IU/L (ref 39–117)
BUN/Creatinine Ratio: 13 (ref 9–23)
BUN: 14 mg/dL (ref 6–20)
Bilirubin Total: <0.2 mg/dL (ref 0.0–1.2)
CO2: 23 mmol/L (ref 20–29)
Calcium: 9.7 mg/dL (ref 8.7–10.2)
Chloride: 100 mmol/L (ref 96–106)
Creatinine, Ser: 1.11 mg/dL — ABNORMAL HIGH (ref 0.57–1.00)
GFR calc Af Amer: 76 mL/min/{1.73_m2} (ref >59)
GFR calc non Af Amer: 66 mL/min/{1.73_m2} (ref >59)
Globulin, Total: 2.5 g/dL (ref 1.5–4.5)
Glucose: 84 mg/dL (ref 65–99)
Potassium: 4.6 mmol/L (ref 3.5–5.2)
Sodium: 139 mmol/L (ref 134–144)
Total Protein: 7.2 g/dL (ref 6.0–8.5)

## 2018-05-22 LAB — CBC WITH DIFFERENTIAL/PLATELET
Basophils Absolute: 0.1 10*3/uL (ref 0.0–0.2)
Basos: 1 %
EOS (ABSOLUTE): 0.5 10*3/uL — ABNORMAL HIGH (ref 0.0–0.4)
Eos: 5 %
Hematocrit: 46.3 % (ref 34.0–46.6)
Hemoglobin: 15.8 g/dL (ref 11.1–15.9)
Immature Grans (Abs): 0 10*3/uL (ref 0.0–0.1)
Immature Granulocytes: 0 %
Lymphocytes Absolute: 1.9 10*3/uL (ref 0.7–3.1)
Lymphs: 21 %
MCH: 29 pg (ref 26.6–33.0)
MCHC: 34.1 g/dL (ref 31.5–35.7)
MCV: 85 fL (ref 79–97)
Monocytes Absolute: 0.6 10*3/uL (ref 0.1–0.9)
Monocytes: 6 %
Neutrophils Absolute: 5.8 10*3/uL (ref 1.4–7.0)
Neutrophils: 67 %
Platelets: 415 10*3/uL (ref 150–450)
RBC: 5.44 x10E6/uL — ABNORMAL HIGH (ref 3.77–5.28)
RDW: 12.7 % (ref 12.3–15.4)
WBC: 8.8 10*3/uL (ref 3.4–10.8)

## 2018-05-22 LAB — PROTIME-INR
INR: 1.1 (ref 0.8–1.2)
Prothrombin Time: 11.2 s (ref 9.1–12.0)

## 2018-05-22 LAB — APTT: aPTT: 35 s — ABNORMAL HIGH (ref 24–33)

## 2018-05-24 ENCOUNTER — Telehealth: Payer: Self-pay | Admitting: Family Medicine

## 2018-05-24 LAB — MRSA CULTURE: MRSA Screen: NEGATIVE

## 2018-05-24 NOTE — Telephone Encounter (Signed)
Copied from CRM [phone]#[id number]. Topic: General - Other >> May 24, 2018  1:49 PM [name], [name] wrote: Reason for CRM: Patient called to confirm that the paperwork for her upcoming surgery has been faxed to the appropriate location.  Dr. Dala DockMcVey left a message in the patient's My Chart for the patient to call today to confirm that the fax had been taken of.  Patient's CB# [phone].

## 2018-05-24 NOTE — Progress Notes (Signed)
Please fax all of pt's lab results to [facility] [phone] Thank you!

## 2018-05-29 DIAGNOSIS — F4310 Post-traumatic stress disorder, unspecified: Secondary | ICD-10-CM | POA: Diagnosis not present

## 2018-05-29 DIAGNOSIS — F3181 Bipolar II disorder: Secondary | ICD-10-CM | POA: Diagnosis not present

## 2018-05-29 DIAGNOSIS — F4010 Social phobia, unspecified: Secondary | ICD-10-CM | POA: Diagnosis not present

## 2018-05-30 ENCOUNTER — Encounter: Payer: Self-pay | Admitting: Physician Assistant

## 2018-06-05 DIAGNOSIS — F4310 Post-traumatic stress disorder, unspecified: Secondary | ICD-10-CM | POA: Diagnosis not present

## 2018-06-05 DIAGNOSIS — F4010 Social phobia, unspecified: Secondary | ICD-10-CM | POA: Diagnosis not present

## 2018-06-05 DIAGNOSIS — F3181 Bipolar II disorder: Secondary | ICD-10-CM | POA: Diagnosis not present

## 2018-06-12 DIAGNOSIS — K5904 Chronic idiopathic constipation: Secondary | ICD-10-CM | POA: Diagnosis not present

## 2018-06-12 DIAGNOSIS — F419 Anxiety disorder, unspecified: Secondary | ICD-10-CM | POA: Diagnosis not present

## 2018-06-12 DIAGNOSIS — F641 Gender identity disorder in adolescence and adulthood: Secondary | ICD-10-CM | POA: Diagnosis not present

## 2018-06-12 DIAGNOSIS — F410 Panic disorder [episodic paroxysmal anxiety] without agoraphobia: Secondary | ICD-10-CM | POA: Diagnosis not present

## 2018-06-12 DIAGNOSIS — F648 Other gender identity disorders: Secondary | ICD-10-CM | POA: Diagnosis not present

## 2018-06-12 DIAGNOSIS — F3181 Bipolar II disorder: Secondary | ICD-10-CM | POA: Diagnosis not present

## 2018-06-12 DIAGNOSIS — F418 Other specified anxiety disorders: Secondary | ICD-10-CM | POA: Diagnosis not present

## 2018-06-12 DIAGNOSIS — F4310 Post-traumatic stress disorder, unspecified: Secondary | ICD-10-CM | POA: Diagnosis not present

## 2018-06-12 DIAGNOSIS — M549 Dorsalgia, unspecified: Secondary | ICD-10-CM | POA: Diagnosis not present

## 2018-06-12 DIAGNOSIS — G8929 Other chronic pain: Secondary | ICD-10-CM | POA: Diagnosis not present

## 2018-06-12 DIAGNOSIS — F319 Bipolar disorder, unspecified: Secondary | ICD-10-CM | POA: Diagnosis not present

## 2018-06-12 DIAGNOSIS — J45998 Other asthma: Secondary | ICD-10-CM | POA: Diagnosis not present

## 2018-06-12 DIAGNOSIS — F488 Other specified nonpsychotic mental disorders: Secondary | ICD-10-CM | POA: Diagnosis not present

## 2018-06-12 DIAGNOSIS — Z79890 Hormone replacement therapy (postmenopausal): Secondary | ICD-10-CM | POA: Diagnosis not present

## 2018-06-12 DIAGNOSIS — K5900 Constipation, unspecified: Secondary | ICD-10-CM | POA: Diagnosis not present

## 2018-06-12 DIAGNOSIS — F649 Gender identity disorder, unspecified: Secondary | ICD-10-CM | POA: Diagnosis not present

## 2018-06-12 HISTORY — PX: SEX TRANSFORMATION SURGERY, MALE TO FEMALE: SHX772

## 2018-06-22 DIAGNOSIS — S01402A Unspecified open wound of left cheek and temporomandibular area, initial encounter: Secondary | ICD-10-CM | POA: Diagnosis not present

## 2018-06-25 DIAGNOSIS — F4310 Post-traumatic stress disorder, unspecified: Secondary | ICD-10-CM | POA: Diagnosis not present

## 2018-06-25 DIAGNOSIS — F4010 Social phobia, unspecified: Secondary | ICD-10-CM | POA: Diagnosis not present

## 2018-06-25 DIAGNOSIS — F3181 Bipolar II disorder: Secondary | ICD-10-CM | POA: Diagnosis not present

## 2018-06-28 ENCOUNTER — Other Ambulatory Visit: Payer: Self-pay

## 2018-06-28 ENCOUNTER — Ambulatory Visit (INDEPENDENT_AMBULATORY_CARE_PROVIDER_SITE_OTHER): Payer: BLUE CROSS/BLUE SHIELD | Admitting: Physician Assistant

## 2018-06-28 ENCOUNTER — Encounter: Payer: Self-pay | Admitting: Physician Assistant

## 2018-06-28 VITALS — BP 118/75 | HR 91 | Temp 98.1°F | Resp 18 | Ht 66.5 in | Wt 202.4 lb

## 2018-06-28 DIAGNOSIS — Z87890 Personal history of sex reassignment: Secondary | ICD-10-CM | POA: Diagnosis not present

## 2018-06-28 MED ORDER — TRAMADOL HCL 50 MG PO TABS: 50.0000 mg | ORAL_TABLET | Freq: Three times a day (TID) | ORAL | 0 refills | Status: DC | PRN

## 2018-06-28 NOTE — Progress Notes (Signed)
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 She is here today with her husband.   Janet Tucker underwent gender transformation surgery October 1st at [facility] in [place] with Dr. Olegario Messier Rumor. She has post-op Skype visit 4 weeks from now and plans to go back to [place] for recheck in 1 year. She is not taking estradiol at this time.  No longer taking spironolactone.  She is still quite numb in her genital area but is concerned that some of her stitches popped. She would like to make sure everything is okay.  Denies pain, drainage of puss, redness, warmth, swelling.     Review of Systems  Constitutional: Negative for chills and fever.  Skin: Negative for wound.    Patient Active Problem List   Diagnosis Date Noted  . Chronic idiopathic constipation 04/19/2018  . Situational syncope 04/19/2018  . Preoperative testing 04/09/2018  . 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 Vitamin D deficiency 02/14/2018  . 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 Other fatigue 01/31/2018  . Shortness of breath on exertion 01/31/2018  . Vitamin B12 deficiency 01/15/2018  . Female-to-female transgender person 11/14/2017  . Bipolar II disorder, most recent episode hypomanic (HCC) 11/14/2017  . PTSD (post-traumatic stress disorder) 11/14/2017  . Social phobia 11/14/2017  . Panic disorder 11/14/2017  .  Anxiety and depression 11/14/2017  . 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 Mild intermittent asthma without complication 02/16/2013  . GERD without esophagitis 02/16/2013    Current Outpatient Medications on File Prior to Visit  Medication Sig Dispense Refill  . ALPRAZolam (NIRAVAM) 0.5 MG dissolvable tablet Take 1 tablet (0.5 mg total) by mouth every 30 (thirty) minutes as needed for anxiety (panic attack). 20 tablet 0  . ARIPiprazole (ABILIFY) 5 MG tablet Take 0.5 tablets (2.5 mg total) by mouth daily. 15 tablet 0  . buPROPion (WELLBUTRIN XL) 300 MG 24 hr tablet   0  . clonazePAM (KLONOPIN) 0.5 MG tablet   1  . estradiol (ESTRACE) 2 MG tablet TAKE 1 TABLET BY MOUTH THREE TIMES DAILY 270 tablet 0  . gabapentin (NEURONTIN) 600 MG tablet Take 600 mg by mouth 2 (two) times daily.    Marland Kitchen lamoTRIgine (LAMICTAL) 100 MG tablet Take 100 mg by mouth daily.    Marland Kitchen lithium carbonate (LITHOBID) 300 MG CR tablet Take 2 tablets (600 mg total) by mouth at bedtime. 60 tablet 0  . phentermine (ADIPEX-P) 37.5 MG tablet Take 1 tablet (37.5 mg total) by mouth 2 (two) times daily before a meal. 60 tablet 1  . zolpidem (AMBIEN) 5 MG tablet Take 5 mg by mouth at bedtime.    Marland Kitchen lubiprostone (AMITIZA) 24 MCG capsule Take 1 capsule (24 mcg total) by mouth 2 (two) times daily with a meal. (Patient not taking: Reported on 06/28/2018) 60 capsule 11  . spironolactone (ALDACTONE) 100 MG tablet Take 100 mg by mouth 2 (two) times daily.     No current facility-administered medications on file prior to visit.  No Known Allergies   Objective:  BP 118/75   Pulse 91   Temp 98.1 F (36.7 C) (Oral)   Resp 18   Ht 5' 6.5" (1.689 m)   Wt 202 lb 6.4 oz (91.8 kg)   SpO2 99%   BMI 32.18 kg/m   Physical Exam  Constitutional: She appears well-developed and well-nourished. No distress.  Genitourinary:    Pelvic exam was performed with patient supine.  Genitourinary Comments: Dissolvable sutures present at areas indicated on  diagram. Skin intact. No erythema, warmth, drainage.   Skin: Skin is warm and dry.  Psychiatric: She has a normal mood and affect. Her behavior is normal. Thought content normal.  Vitals reviewed.   Assessment and Plan :  This case was discussed with Dr. Clelia Croft  1. Trans-sexualism, status post gender reassignment surgery - Pt presents following gender transformation surgery concerned about her sutures. No sign of infection or problem with sutures from a post-op perspective. Discussed with pt I do not have experience examining post-op gender reassignment surgery. She understands and is happy to know there is no sign of infection or problem with her sutures. Start estradiol 2 mg once daily - if no perimenopausal symptoms, increase medication to 2mg  bid. RTC in 6 weeks estradiol and CBC. - traMADol (ULTRAM) 50 MG tablet; Take 1 tablet (50 mg total) by mouth every 8 (eight) hours as needed.  Dispense: 30 tablet; Refill: 0   Whitney Finnley Lewis, PA-C  Primary Care at [facility] 06/28/2018 11:09 AM  Please note: Portions of this report may have been transcribed using dragon voice recognition software. Every effort was made to ensure accuracy; however, inadvertent computerized transcription errors may be present.

## 2018-06-28 NOTE — Patient Instructions (Addendum)
  Estradiol  Start '2mg'$  once daily. If hot flashes (perimenopausal symptoms) continue can go up to '2mg'$  twice daily.  You no longer need spirololactone.  Come back and recheck in 6 weeks.   Please recheck with surgeon    Thank you for coming in today. I hope you feel we met your needs.  Feel free to call PCP if you have any questions or further requests.  Please consider signing up for MyChart if you do not already have it, as this is a great way to communicate with me.  Best,  Whitney McVey, PA-C  IF you received an x-ray today, you will receive an invoice from [facility]. Please contact [facility] at [phone] with questions or concerns regarding your invoice.   IF you received labwork today, you will receive an invoice from [facility]. Please contact [facility] at [phone] with questions or concerns regarding your invoice.   Our billing staff will not be able to assist you with questions regarding bills from these companies.  You will be contacted with the lab results as soon as they are available. The fastest way to get your results is to activate your My Chart account. Instructions are located on the last page of this paperwork. If you have not heard from [place] regarding the results in 2 weeks, please contact this office.

## 2018-07-02 DIAGNOSIS — F4310 Post-traumatic stress disorder, unspecified: Secondary | ICD-10-CM | POA: Diagnosis not present

## 2018-07-02 DIAGNOSIS — F4010 Social phobia, unspecified: Secondary | ICD-10-CM | POA: Diagnosis not present

## 2018-07-02 DIAGNOSIS — F3181 Bipolar II disorder: Secondary | ICD-10-CM | POA: Diagnosis not present

## 2018-07-04 DIAGNOSIS — Z87890 Personal history of sex reassignment: Secondary | ICD-10-CM | POA: Insufficient documentation

## 2018-07-09 ENCOUNTER — Ambulatory Visit (INDEPENDENT_AMBULATORY_CARE_PROVIDER_SITE_OTHER): Payer: BLUE CROSS/BLUE SHIELD | Admitting: Physician Assistant

## 2018-07-09 ENCOUNTER — Encounter: Payer: Self-pay | Admitting: Physician Assistant

## 2018-07-09 ENCOUNTER — Telehealth: Payer: Self-pay | Admitting: Physician Assistant

## 2018-07-09 ENCOUNTER — Other Ambulatory Visit: Payer: Self-pay

## 2018-07-09 VITALS — BP 111/76 | HR 83 | Temp 97.9°F | Resp 20 | Ht 66.14 in | Wt 202.6 lb

## 2018-07-09 DIAGNOSIS — Z87890 Personal history of sex reassignment: Secondary | ICD-10-CM

## 2018-07-09 DIAGNOSIS — F3181 Bipolar II disorder: Secondary | ICD-10-CM | POA: Diagnosis not present

## 2018-07-09 DIAGNOSIS — T8149XA Infection following a procedure, other surgical site, initial encounter: Secondary | ICD-10-CM | POA: Diagnosis not present

## 2018-07-09 DIAGNOSIS — R234 Changes in skin texture: Secondary | ICD-10-CM | POA: Diagnosis not present

## 2018-07-09 DIAGNOSIS — N898 Other specified noninflammatory disorders of vagina: Secondary | ICD-10-CM | POA: Diagnosis not present

## 2018-07-09 DIAGNOSIS — F4310 Post-traumatic stress disorder, unspecified: Secondary | ICD-10-CM | POA: Diagnosis not present

## 2018-07-09 DIAGNOSIS — F4010 Social phobia, unspecified: Secondary | ICD-10-CM | POA: Diagnosis not present

## 2018-07-09 MED ORDER — LIDOCAINE 5 % EX OINT: 1.0000 "application " | TOPICAL_OINTMENT | CUTANEOUS | 0 refills | Status: DC | PRN

## 2018-07-09 MED ORDER — DOXYCYCLINE HYCLATE 100 MG PO CAPS: 100.0000 mg | ORAL_CAPSULE | Freq: Two times a day (BID) | ORAL | 0 refills | Status: DC

## 2018-07-09 MED ORDER — DILTIAZEM GEL 2 %: CUTANEOUS | 0 refills | Status: DC

## 2018-07-09 NOTE — Patient Instructions (Addendum)
I highly recommend getting your operative notes from your surgeon. .  For discharge: take doxy. Contact your surgeon and let them know that there is concern for infection. For the tear, use lidocaine and diltiazem gel topically.   While taking Doxycycline:  -Do not drink milk or take iron supplements, multivitamins, calcium supplements, antacids, laxatives within 2 hours before or after taking doxycycline. -Avoid direct exposure to sunlight or tanning beds. Doxycycline can make you sunburn more easily. Wear protective clothing and use sunscreen (SPF 30 or higher) when you are outdoors. -Antibiotic medicines can cause diarrhea, which may be a sign of a new infection. If you have diarrhea that is watery or bloody, stop taking this medicine and seek medical care.    If you have lab work done today you will be contacted with your lab results within the next 2 weeks.  If you have not heard from [place] then please contact us. The fastest way to get your results is to register for My Chart.   IF you received an x-ray today, you will receive an invoice from [facility]. Please contact [facility] at [phone] with questions or concerns regarding your invoice.   IF you received labwork today, you will receive an invoice from [facility]. Please contact [facility] at [phone] with questions or concerns regarding your invoice.   Our billing staff will not be able to assist you with questions regarding bills from these companies.  You will be contacted with the lab results as soon as they are available. The fastest way to get your results is to activate your My Chart account. Instructions are located on the last page of this paperwork. If you have not heard from [place] regarding the results in 2 weeks, please contact this office.

## 2018-07-09 NOTE — Progress Notes (Signed)
07/10/2018 at 9:10 AM  Janet Tucker / DOB: 1986/06/28 / MRN: [id number]  The patient has Mild intermittent asthma without complication; GERD without esophagitis; Female-to-female transgender person; Bipolar II disorder, most recent episode hypomanic (HCC); PTSD (post-traumatic stress disorder); Social phobia; Panic disorder; Anxiety and depression; Vitamin B12 deficiency; Other fatigue; Shortness of breath on exertion; Class 1 drug-induced obesity with serious comorbidity and body mass index (BMI) of 32.0 to 32.9 in adult; Vitamin D deficiency; Insulin resistance; Preoperative testing; Chronic idiopathic constipation; Psychogenic syncope; Situational syncope; and Trans-sexualism, status post gender reassignment surgery on their problem list.  SUBJECTIVE  Janet Tucker is a 32 y.o. 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  Patient had gender transformation surgery on October 1st at [facility] in [place] with Dr. Kristen Tucker.  Plans for postop scribe visit in a couple weeks.  We will plan to go back to [facility] for recheck in 1 year.  Does not have any surgical documents with her today.  Was told to follow-up with her primary care if anything was concerning.  Today, reports noticing vaginal discharge for 2 to 3 days when using vaginal dilators.  She has 3 sizes of dilators that she has to use each day for 5 minutes at a time.  Largest dilator is about 1 1/2 inches in diameter.  The vaginal discharge she is noticing is yellow in coloration.  Also feels like there is a surrounding tear.  Denies pain, redness,  warmth, swelling, fever, chills, nausea, vomiting.  She is still quite numb in her genital area.  No other questions or concerns today.  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    Medications reviewed and updated by myself where necessary, and exist elsewhere in the encounter.   Ms. Janet Tucker has No Known Allergies. She  reports that she has never smoked. She has never used smokeless tobacco. She reports that she drinks about 1.0 standard drinks of alcohol per week. She reports that she does not use drugs. She  reports that she currently engages in sexual activity. She reports using the following method of birth control/protection: Other-see comments. The patient  has a past surgical history that includes Breast surgery.  Her family history includes Alcoholism in her father and mother; Anxiety disorder in her father and mother; Bipolar disorder in her mother; Cancer in her maternal grandmother and paternal grandfather; Depression in her father and mother; Diabetes in her father and mother; Drug abuse in her mother; Heart disease in her father and paternal grandfather; Hyperlipidemia in her father; Hypertension in her father; Mental illness in her paternal grandmother; Obesity in her mother; Sleep apnea in her mother.  Review of Systems  Constitutional: Negative for malaise/fatigue.  Genitourinary: Negative for dysuria, flank pain, frequency, hematuria and urgency.    OBJECTIVE  Her  height is 5' 6.14" (1.68 m) and weight is 202 lb 9.6 oz (91.9 kg). Her oral temperature is 97.9 F (36.6 C). Her blood pressure is  111/76 and her pulse is 83. Her respiration is 20 and oxygen saturation is  100%.  The patient's body mass index is 32.56 kg/m.  Physical Exam   Constitutional: She appears well-developed and well-nourished. No distress.  Genitourinary:    Pelvic exam was performed with patient supine.  Genitourinary Comments: Well healing surgical incisions present at areas indicated on diagram. Few dissolvable sutures intact at most superior and inferior aspect of b/l incisions. There is a mild amount of purulent drainage noted over 3 of the remaining sutures. No purulent drainage expressed. No surrounding erythema, warmth, or fluctuance. Mild milky-white film noted within neo-vagina, no abscess identified. there is a small 2 mm fissure at perineum with no overlying erythema, warmth, or drainage.   Skin: Skin is warm and dry.  Psychiatric: She has a normal mood and affect. Her behavior is normal. Thought content normal.  Vitals reviewed.  No results found for this or any previous visit (from the past 24 hour(s)).  ASSESSMENT & PLAN  1. Surgical wound infection Mild amount of purulent discharge noted over the dissolvable sutures.  No discharge expressed, which is reassuring.  No surrounding erythema, warmth, or fluctuance.  She is overall well-appearing, no distress.  No systemic symptoms.  Wound culture obtained.  Wet prep was obtained as well, no yeast noted.  Attempted to contact patient's surgeon in [place] with no success.  Left a voicemail with contact information.  Did not perform bimanual or speculum exam due to unfamiliarity with the surgery completed and concern for potential damage if performed.  Will treat empirically with antibiotics at this time with close follow-up in 2 days.  If concern there is a deeper infection at that time, could possibly obtain CT for further evaluation.  Encouraged to continue with proper hygiene techniques.  If she develops any systemic symptoms, recommend she go to [facility] for further evaluation and management. - WOUND CULTURE -  doxycycline (VIBRAMYCIN) 100 MG capsule; Take 1 capsule (100 mg total) by mouth 2 (two) times daily.  Dispense: 20 capsule; Refill: 0  2. Fissure in skin Fissure noted at perineum, which continues to be disrupted due to dilation.  Given Rx for topical treatment. - lidocaine (XYLOCAINE) 5 % ointment; Apply 1 application topically as needed.  Dispense: 35.44 g; Refill: 0 - diltiazem 2 % GEL; Apply a pea-sized amount to the affected area 3 times daily.  Dispense: 30 g; Refill: 0  3. Trans-sexualism, status post gender reassignment surgery  Benjiman Core, PA-C  Primary [facility] at [facility] 07/10/2018 9:10 AM

## 2018-07-09 NOTE — Telephone Encounter (Signed)
Raynelle Fanning, This PA needs your signature. If you could also please look into the ICD-10 code. I couldn't find any codes listed on her chart. The only thing I saw was the sex reassignment surgery and Doyne Keel said to go ahead and use that code for the tear - which is what [place] prescribed the med for.   Please sign and advise.  Thank you!   KEY: [id number]

## 2018-07-10 ENCOUNTER — Encounter: Payer: Self-pay | Admitting: Physician Assistant

## 2018-07-10 ENCOUNTER — Telehealth: Payer: Self-pay | Admitting: Physician Assistant

## 2018-07-10 NOTE — Telephone Encounter (Signed)
Sent!

## 2018-07-10 NOTE — Telephone Encounter (Signed)
Copied from CRM [id number]. Topic: General - Other >> Jul 10, 2018 12:18 PM Lynne Logan D wrote: Reason for CRM: Pt left surgeon contact information for [employer]. Please have [place] call Curtis Sites at [phone] if she has any questions about Pt's surgery or call Pt and she can give details.

## 2018-07-11 ENCOUNTER — Telehealth: Payer: Self-pay | Admitting: Physician Assistant

## 2018-07-11 ENCOUNTER — Ambulatory Visit: Payer: BLUE CROSS/BLUE SHIELD | Admitting: Physician Assistant

## 2018-07-11 LAB — WOUND CULTURE

## 2018-07-11 NOTE — Telephone Encounter (Signed)
Spoke with Curtis Sites about patient's prior office visit.  She informed me that the patient sent pictures of the affected area to their office and the surgeon stated that this was completely normal discharge and of no concern.  No further work-up needed.

## 2018-07-11 NOTE — Telephone Encounter (Signed)
Attempted to call Curtis Sites at provided number. No answer. Left voicemail and gave her my cell number to call back.

## 2018-07-12 ENCOUNTER — Telehealth: Payer: Self-pay | Admitting: Physician Assistant

## 2018-07-12 NOTE — Telephone Encounter (Signed)
Pt's RX for Lidocaine 5% EX OINT has been denied. I have placed the formes in [facility] box at nurses station  Please advise

## 2018-07-12 NOTE — Telephone Encounter (Signed)
Please see message below about denial of RX.

## 2018-07-13 NOTE — Telephone Encounter (Signed)
Please call pt.  Ask her to call her insurance and see if there is anything similar to this medication that is covered.   If there are no other options, she can use goodrx card and get it for 20$ at [employer].

## 2018-07-17 ENCOUNTER — Telehealth: Payer: Self-pay | Admitting: *Deleted

## 2018-07-17 DIAGNOSIS — F4310 Post-traumatic stress disorder, unspecified: Secondary | ICD-10-CM | POA: Diagnosis not present

## 2018-07-17 DIAGNOSIS — F3181 Bipolar II disorder: Secondary | ICD-10-CM | POA: Diagnosis not present

## 2018-07-17 DIAGNOSIS — F4010 Social phobia, unspecified: Secondary | ICD-10-CM | POA: Diagnosis not present

## 2018-07-17 NOTE — Telephone Encounter (Signed)
Patient said she is not going to worry about it and just not use it. thanks

## 2018-07-17 NOTE — Telephone Encounter (Signed)
Spoke with patient,informed her that I tried to contact her this am, voicemail was full. advised her of message from [place], [place] and if medication is still high after she contacts insurance co., that our office can give her a goodrx card. She stated that she is not "going to worry about it."

## 2018-07-17 NOTE — Telephone Encounter (Signed)
Called pt to give message from [place], regarding her Rx. Pt's mail box is full, unable to leave message.

## 2018-08-03 ENCOUNTER — Ambulatory Visit (INDEPENDENT_AMBULATORY_CARE_PROVIDER_SITE_OTHER): Payer: BLUE CROSS/BLUE SHIELD | Admitting: Family Medicine

## 2018-08-03 ENCOUNTER — Encounter: Payer: Self-pay | Admitting: Family Medicine

## 2018-08-03 ENCOUNTER — Other Ambulatory Visit: Payer: Self-pay

## 2018-08-03 VITALS — BP 114/77 | HR 72 | Temp 99.1°F | Resp 16 | Ht 66.0 in | Wt 204.8 lb

## 2018-08-03 DIAGNOSIS — H6983 Other specified disorders of Eustachian tube, bilateral: Secondary | ICD-10-CM

## 2018-08-03 DIAGNOSIS — J069 Acute upper respiratory infection, unspecified: Secondary | ICD-10-CM | POA: Diagnosis not present

## 2018-08-03 DIAGNOSIS — R05 Cough: Secondary | ICD-10-CM

## 2018-08-03 DIAGNOSIS — J209 Acute bronchitis, unspecified: Secondary | ICD-10-CM

## 2018-08-03 DIAGNOSIS — R059 Cough, unspecified: Secondary | ICD-10-CM

## 2018-08-03 MED ORDER — AZITHROMYCIN 250 MG PO TABS: ORAL_TABLET | ORAL | 0 refills | Status: DC

## 2018-08-03 MED ORDER — HYDROCODONE-HOMATROPINE 5-1.5 MG/5ML PO SYRP: 5.0000 mL | ORAL_SOLUTION | ORAL | 0 refills | Status: DC | PRN

## 2018-08-03 MED ORDER — BENZONATATE 100 MG PO CAPS: 100.0000 mg | ORAL_CAPSULE | Freq: Three times a day (TID) | ORAL | 0 refills | Status: DC | PRN

## 2018-08-03 NOTE — Patient Instructions (Addendum)
Drink plenty of fluids and try to get enough rest  You still may be contagious to others, so avoid getting other people's faces.  Take benzonatate cough pills 2 pills 3 times daily as needed for daytime cough  Take Hycodan cough syrup 1 teaspoon every 4-6 hours if needed for worse cough or for nighttime cough.  This tends to be sedating.  Use fluticasone nose spray 2 sprays each nostril twice daily to try and help open up the eustachian tubes  If not doing better over the next 2 or 3 days get the prescription for azithromycin filled and take 2 pills initially, then 1 daily for 4 days  Return if not improving    If you have lab work done today you will be contacted with your lab results within the next 2 weeks.  If you have not heard from us then please contact us. The fastest way to get your results is to register for My Chart.   IF you received an x-ray today, you will receive an invoice from [facility]. Please contact [facility] at [phone] with questions or concerns regarding your invoice.   IF you received labwork today, you will receive an invoice from [facility]. Please contact [facility] at [phone] with questions or concerns regarding your invoice.   Our billing staff will not be able to assist you with questions regarding bills from these companies.  You will be contacted with the lab results as soon as they are available. The fastest way to get your results is to activate your My Chart account. Instructions are located on the last page of this paperwork. If you have not heard from us regarding the results in 2 weeks, please contact this office.

## 2018-08-03 NOTE — Progress Notes (Signed)
Patient ID: Jonah Blue, adult    DOB: [birth year]  Age: 32 y.o. MRN: [id number]  Chief Complaint  Patient presents with  . Sore Throat    x 5 days  . Cough    x 4 days, "started the next day after sore throat."    Subjective:   32 year old lady with a history of a respiratory tract infection that started Sunday, 5 days ago.  She is not employed, as she is in the postop phase from her recent transgender surgery.  She does not smoke.  She is on a list of medications which are in the chart, most of which are related to her bipolar and nerve problems.  She does not have a history of excessive number of respiratory tract infections.  She has been having problems with stuffiness and popping in her ears, sore throat, productive cough, rhinorrhea.  Current allergies, medications, problem list, past/family and social histories reviewed.  Objective:  BP 114/77 (BP Location: Right Arm, Patient Position: Sitting, Cuff Size: Large)   Pulse 72   Temp 99.1 F (37.3 C) (Oral)   Resp 16   Ht 5\' 6"  (1.676 m)   Wt 204 lb 12.8 oz (92.9 kg)   SpO2 95%   BMI 33.06 kg/m   No major acute distress.  TMs normal.  Throat not erythematous or edematous.  Neck supple without significant nodes.  No major tenderness over her frontal or maxillary sinuses.  Chest is clear to auscultation with the exception of a few little rhonchi at the left base posteriorly.  Heart regular without murmurs.  Abdomen soft.  No edema.  Assessment & Plan:   Assessment: 1. Acute upper respiratory infection   2. Dysfunction of both eustachian tubes   3. Cough   4. Acute bronchitis, unspecified organism       Plan: See instructions  No orders of the defined types were placed in this encounter.   Meds ordered this encounter  Medications  . HYDROcodone-homatropine (HYCODAN) 5-1.5 MG/5ML syrup    Sig: Take 5 mLs by mouth every 4 (four) hours as needed.    Dispense:  60 mL    Refill:  0  . azithromycin (ZITHROMAX) 250 MG  tablet    Sig: Take 2 tabs PO x 1 dose, then 1 tab PO QD x 4 days    Dispense:  6 tablet    Refill:  0  . benzonatate (TESSALON) 100 MG capsule    Sig: Take 1-2 capsules (100-200 mg total) by mouth 3 (three) times daily as needed.    Dispense:  30 capsule    Refill:  0    Do not fill after 08/12/2018         Patient Instructions   Drink plenty of fluids and try to get enough rest  You still may be contagious to others, so avoid getting other people's faces.  Take benzonatate cough pills 2 pills 3 times daily as needed for daytime cough  Take Hycodan cough syrup 1 teaspoon every 4-6 hours if needed for worse cough or for nighttime cough.  This tends to be sedating.  Use fluticasone nose spray 2 sprays each nostril twice daily to try and help open up the eustachian tubes  If not doing better over the next 2 or 3 days get the prescription for azithromycin filled and take 2 pills initially, then 1 daily for 4 days  Return if not improving    If you have lab work done today you will  be contacted with your lab results within the next 2 weeks.  If you have not heard from us then please contact us. The fastest way to get your results is to register for My Chart.   IF you received an x-ray today, you will receive an invoice from [facility]. Please contact [facility] at [phone] with questions or concerns regarding your invoice.   IF you received labwork today, you will receive an invoice from [facility]. Please contact [facility] at [phone] with questions or concerns regarding your invoice.   Our billing staff will not be able to assist you with questions regarding bills from these companies.  You will be contacted with the lab results as soon as they are available. The fastest way to get your results is to activate your My Chart account. Instructions are located on the last page of this paperwork. If you have not heard from us regarding the results in 2  weeks, please contact this office.        Return if symptoms worsen or fail to improve.   Janace Hoardavid , MD 08/03/2018

## 2018-08-13 ENCOUNTER — Ambulatory Visit (INDEPENDENT_AMBULATORY_CARE_PROVIDER_SITE_OTHER): Payer: BLUE CROSS/BLUE SHIELD | Admitting: Family Medicine

## 2018-08-13 ENCOUNTER — Encounter: Payer: Self-pay | Admitting: Family Medicine

## 2018-08-13 ENCOUNTER — Other Ambulatory Visit: Payer: Self-pay

## 2018-08-13 VITALS — BP 124/78 | HR 83 | Temp 98.0°F | Resp 16 | Ht 66.0 in | Wt 215.0 lb

## 2018-08-13 DIAGNOSIS — N760 Acute vaginitis: Secondary | ICD-10-CM

## 2018-08-13 DIAGNOSIS — N898 Other specified noninflammatory disorders of vagina: Secondary | ICD-10-CM

## 2018-08-13 DIAGNOSIS — Z79899 Other long term (current) drug therapy: Secondary | ICD-10-CM

## 2018-08-13 DIAGNOSIS — B9689 Other specified bacterial agents as the cause of diseases classified elsewhere: Secondary | ICD-10-CM

## 2018-08-13 DIAGNOSIS — Z79818 Long term (current) use of other agents affecting estrogen receptors and estrogen levels: Secondary | ICD-10-CM | POA: Diagnosis not present

## 2018-08-13 DIAGNOSIS — F4310 Post-traumatic stress disorder, unspecified: Secondary | ICD-10-CM | POA: Diagnosis not present

## 2018-08-13 DIAGNOSIS — F4010 Social phobia, unspecified: Secondary | ICD-10-CM | POA: Diagnosis not present

## 2018-08-13 DIAGNOSIS — F3181 Bipolar II disorder: Secondary | ICD-10-CM | POA: Diagnosis not present

## 2018-08-13 LAB — POCT URINALYSIS DIP (MANUAL ENTRY)
Bilirubin, UA: NEGATIVE
Blood, UA: NEGATIVE
Glucose, UA: NEGATIVE mg/dL
Ketones, POC UA: NEGATIVE mg/dL
Nitrite, UA: NEGATIVE
Protein Ur, POC: NEGATIVE mg/dL
Spec Grav, UA: 1.015 (ref 1.010–1.025)
Urobilinogen, UA: 0.2 E.U./dL
pH, UA: 6 (ref 5.0–8.0)

## 2018-08-13 LAB — POCT WET + KOH PREP
Trich by wet prep: ABSENT
Yeast by KOH: ABSENT
Yeast by wet prep: ABSENT

## 2018-08-13 MED ORDER — METRONIDAZOLE 0.75 % VA GEL: 1.0000 | Freq: Every day | VAGINAL | 0 refills | Status: DC

## 2018-08-13 MED ORDER — LIDOCAINE HCL URETHRAL/MUCOSAL 2 % EX GEL: 1.0000 "application " | CUTANEOUS | 1 refills | Status: DC | PRN

## 2018-08-13 MED ORDER — ESTRADIOL 2 MG PO TABS: ORAL_TABLET | ORAL | 1 refills | Status: AC

## 2018-08-13 NOTE — Patient Instructions (Addendum)
If you have lab work done today you will be contacted with your lab results within the next 2 weeks.  If you have not heard from us then please contact us. The fastest way to get your results is to register for My Chart.   IF you received an x-ray today, you will receive an invoice from [facility]. Please contact [facility] at [phone] with questions or concerns regarding your invoice.   IF you received labwork today, you will receive an invoice from [facility]. Please contact [facility] at [phone] with questions or concerns regarding your invoice.   Our billing staff will not be able to assist you with questions regarding bills from these companies.  You will be contacted with the lab results as soon as they are available. The fastest way to get your results is to activate your My Chart account. Instructions are located on the last page of this paperwork. If you have not heard from us regarding the results in 2 weeks, please contact this office.     Pyogenic Granuloma Pyogenic granuloma is a growth (lesion) that forms on the skin or on the mucous membranes of the mouth. This type of growth is a lump of very red tissue that bleeds easily. A pyogenic granuloma is usually a single lesion that most often affects:  The head and neck.  The mucous membranes of the mouth or tongue.  The upper body.  The hands and feet.  A pyogenic granuloma usually measures about 0.5 inch (1.3 cm), but lesions can be smaller or larger. This condition does not spread from person to person (is not contagious). The lesion is not cancerous (benign). What are the causes? A pyogenic granuloma results from a reaction of your skin or mucous membranes. The reaction causes a mound of tiny blood vessels (capillaries) to form a lesion. This often happens after a minor injury, like pricking your skin or biting your lip or tongue. Sometimes it occurs without an injury. The exact cause of the  reaction is not known. What increases the risk? The condition is more likely to develop in:  Pregnant women.  Children and young adults.  People who take certain medicines, especially acne treatment drugs, birth control pills, and some medicines used to treat cancer or HIV/AIDS.  What are the signs or symptoms? The main symptom of this condition is a raised or lumpy lesion that is very red. The lesion may also:  Have a crusty, ulcerated surface.  Bleed easily.  Be slightly sore.  How is this diagnosed? This condition is diagnosed based on your symptoms and medical history, especially if you recently had an injury. Your health care provider will also do a physical exam. Your health care provider may remove a small piece of the granuloma for testing (biopsy) to rule out cancer. How is this treated? A small lesion may go away without treatment. You may have to stop or change any medicines that caused the lesion. Pyogenic granulomas caused by pregnancy usually go away after delivery. If your legion is large, irritated, or bleeds easily, you may need to have the lesion removed. This may involve:  Scraping away the lesion (curettage).  Using chemicals or electric energy to destroy the lesion.  Removing the lesion along with a small piece of normal skin or mucous membrane (surgical excision). This is the best treatment to prevent the lesion from coming back.  Follow these instructions at home:  Take over-the-counter and prescription medicines only as  told by your health care provider.  Keep all follow-up visits as told by your health care provider. This is important. Contact a health care provider if:  You have a fever.  Your lesion bleeds.  Your lesion comes back after treatment. This information is not intended to replace advice given to you by your health care provider. Make sure you discuss any questions you have with your health care provider. Document Released: 09/13/2015  Document Revised: 02/04/2016 Document Reviewed: 09/13/2015 Elsevier Interactive Patient Education  2018 [employer].  Bacterial Vaginosis Bacterial vaginosis is a vaginal infection that occurs when the normal balance of bacteria in the vagina is disrupted. It results from an overgrowth of certain bacteria. This is the most common vaginal infection among women ages 61-44. Because bacterial vaginosis increases your risk for STIs (sexually transmitted infections), getting treated can help reduce your risk for chlamydia, gonorrhea, herpes, and HIV (human immunodeficiency virus). Treatment is also important for preventing complications in pregnant women, because this condition can cause an early (premature) delivery. What are the causes? This condition is caused by an increase in harmful bacteria that are normally present in small amounts in the vagina. However, the reason that the condition develops is not fully understood. What increases the risk? The following factors may make you more likely to develop this condition:  Having a new sexual partner or multiple sexual partners.  Having unprotected sex.  Douching.  Having an intrauterine device (IUD).  Smoking.  Drug and alcohol abuse.  Taking certain antibiotic medicines.  Being pregnant.  You cannot get bacterial vaginosis from toilet seats, bedding, swimming pools, or contact with objects around you. What are the signs or symptoms? Symptoms of this condition include:  Grey or white vaginal discharge. The discharge can also be watery or foamy.  A fish-like odor with discharge, especially after sexual intercourse or during menstruation.  Itching in and around the vagina.  Burning or pain with urination.  Some women with bacterial vaginosis have no signs or symptoms. How is this diagnosed? This condition is diagnosed based on:  Your medical history.  A physical exam of the vagina.  Testing a sample of vaginal fluid under a  microscope to look for a large amount of bad bacteria or abnormal cells. Your health care provider may use a cotton swab or a small wooden spatula to collect the sample.  How is this treated? This condition is treated with antibiotics. These may be given as a pill, a vaginal cream, or a medicine that is put into the vagina (suppository). If the condition comes back after treatment, a second round of antibiotics may be needed. Follow these instructions at home: Medicines  Take over-the-counter and prescription medicines only as told by your health care provider.  Take or use your antibiotic as told by your health care provider. Do not stop taking or using the antibiotic even if you start to feel better. General instructions  If you have a female sexual partner, tell her that you have a vaginal infection. She should see her health care provider and be treated if she has symptoms. If you have a female sexual partner, he does not need treatment.  During treatment: ? Avoid sexual activity until you finish treatment. ? Do not douche. ? Avoid alcohol as directed by your health care provider. ? Avoid breastfeeding as directed by your health care provider.  Drink enough water and fluids to keep your urine clear or pale yellow.  Keep the area around your  vagina and rectum clean. ? Wash the area daily with warm water. ? Wipe yourself from front to back after using the toilet.  Keep all follow-up visits as told by your health care provider. This is important. How is this prevented?  Do not douche.  Wash the outside of your vagina with warm water only.  Use protection when having sex. This includes latex condoms and dental dams.  Limit how many sexual partners you have. To help prevent bacterial vaginosis, it is best to have sex with just one partner (monogamous).  Make sure you and your sexual partner are tested for STIs.  Wear cotton or cotton-lined underwear.  Avoid wearing tight pants  and pantyhose, especially during summer.  Limit the amount of alcohol that you drink.  Do not use any products that contain nicotine or tobacco, such as cigarettes and e-cigarettes. If you need help quitting, ask your health care provider.  Do not use illegal drugs. Where to find more information:  Centers for Disease Control and Prevention: SolutionApps.co.za  American Sexual Health Association (ASHA): www.ashastd.org  U.S. Department of Health and Health and safety inspector, Office on Women's Health: ConventionalMedicines.si or http://www.anderson-williamson.info/ Contact a health care provider if:  Your symptoms do not improve, even after treatment.  You have more discharge or pain when urinating.  You have a fever.  You have pain in your abdomen.  You have pain during sex.  You have vaginal bleeding between periods. Summary  Bacterial vaginosis is a vaginal infection that occurs when the normal balance of bacteria in the vagina is disrupted.  Because bacterial vaginosis increases your risk for STIs (sexually transmitted infections), getting treated can help reduce your risk for chlamydia, gonorrhea, herpes, and HIV (human immunodeficiency virus). Treatment is also important for preventing complications in pregnant women, because the condition can cause an early (premature) delivery.  This condition is treated with antibiotic medicines. These may be given as a pill, a vaginal cream, or a medicine that is put into the vagina (suppository). This information is not intended to replace advice given to you by your health care provider. Make sure you discuss any questions you have with your health care provider. Document Released: 08/29/2005 Document Revised: 01/02/2017 Document Reviewed: 05/14/2016 Elsevier Interactive Patient Education  [employer].
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
## 2018-08-14 DIAGNOSIS — F3181 Bipolar II disorder: Secondary | ICD-10-CM | POA: Diagnosis not present

## 2018-08-14 DIAGNOSIS — F4010 Social phobia, unspecified: Secondary | ICD-10-CM | POA: Diagnosis not present

## 2018-08-14 DIAGNOSIS — F4310 Post-traumatic stress disorder, unspecified: Secondary | ICD-10-CM | POA: Diagnosis not present

## 2018-08-14 LAB — CBC WITH DIFFERENTIAL/PLATELET
BASOS: 1 %
Basophils Absolute: 0 10*3/uL (ref 0.0–0.2)
EOS (ABSOLUTE): 0.5 10*3/uL — ABNORMAL HIGH (ref 0.0–0.4)
Eos: 7 %
Hematocrit: 37 % (ref 34.0–46.6)
Hemoglobin: 12.7 g/dL (ref 11.1–15.9)
IMMATURE GRANS (ABS): 0 10*3/uL (ref 0.0–0.1)
Immature Granulocytes: 0 %
LYMPHS ABS: 2 10*3/uL (ref 0.7–3.1)
LYMPHS: 29 %
MCH: 28 pg (ref 26.6–33.0)
MCHC: 34.3 g/dL (ref 31.5–35.7)
MCV: 82 fL (ref 79–97)
MONOS ABS: 0.4 10*3/uL (ref 0.1–0.9)
Monocytes: 6 %
NEUTROS ABS: 3.9 10*3/uL (ref 1.4–7.0)
Neutrophils: 57 %
PLATELETS: 396 10*3/uL (ref 150–450)
RBC: 4.54 x10E6/uL (ref 3.77–5.28)
RDW: 13 % (ref 12.3–15.4)
WBC: 6.7 10*3/uL (ref 3.4–10.8)

## 2018-08-14 LAB — COMPREHENSIVE METABOLIC PANEL
A/G RATIO: 1.6 (ref 1.2–2.2)
ALT: 87 IU/L — AB (ref 0–32)
AST: 62 IU/L — ABNORMAL HIGH (ref 0–40)
Albumin: 4.2 g/dL (ref 3.5–5.5)
Alkaline Phosphatase: 57 IU/L (ref 39–117)
BUN / CREAT RATIO: 22 (ref 9–23)
BUN: 21 mg/dL — AB (ref 6–20)
Bilirubin Total: <0.2 mg/dL (ref 0.0–1.2)
CALCIUM: 9.4 mg/dL (ref 8.7–10.2)
CO2: 21 mmol/L (ref 20–29)
Chloride: 100 mmol/L (ref 96–106)
Creatinine, Ser: 0.94 mg/dL (ref 0.57–1.00)
GFR, EST AFRICAN AMERICAN: 93 mL/min/{1.73_m2} (ref >59)
GFR, EST NON AFRICAN AMERICAN: 81 mL/min/{1.73_m2} (ref >59)
Globulin, Total: 2.6 g/dL (ref 1.5–4.5)
Glucose: 96 mg/dL (ref 65–99)
POTASSIUM: 4.4 mmol/L (ref 3.5–5.2)
SODIUM: 137 mmol/L (ref 134–144)
TOTAL PROTEIN: 6.8 g/dL (ref 6.0–8.5)

## 2018-08-14 LAB — TESTOSTERONE: TESTOSTERONE: 7 ng/dL — AB (ref 8–48)

## 2018-08-14 LAB — ESTRADIOL: Estradiol: 61.7 pg/mL

## 2018-08-29 DIAGNOSIS — F4010 Social phobia, unspecified: Secondary | ICD-10-CM | POA: Diagnosis not present

## 2018-08-29 DIAGNOSIS — F3181 Bipolar II disorder: Secondary | ICD-10-CM | POA: Diagnosis not present

## 2018-08-29 DIAGNOSIS — F4310 Post-traumatic stress disorder, unspecified: Secondary | ICD-10-CM | POA: Diagnosis not present

## 2018-09-10 DIAGNOSIS — F3181 Bipolar II disorder: Secondary | ICD-10-CM | POA: Diagnosis not present

## 2018-09-10 DIAGNOSIS — F4310 Post-traumatic stress disorder, unspecified: Secondary | ICD-10-CM | POA: Diagnosis not present

## 2018-09-10 DIAGNOSIS — F4010 Social phobia, unspecified: Secondary | ICD-10-CM | POA: Diagnosis not present

## 2018-09-12 DIAGNOSIS — Z87898 Personal history of other specified conditions: Secondary | ICD-10-CM

## 2018-09-12 HISTORY — DX: Personal history of other specified conditions: Z87.898

## 2018-09-13 DIAGNOSIS — H6692 Otitis media, unspecified, left ear: Secondary | ICD-10-CM | POA: Diagnosis not present

## 2018-09-13 DIAGNOSIS — J209 Acute bronchitis, unspecified: Secondary | ICD-10-CM | POA: Diagnosis not present

## 2018-09-13 DIAGNOSIS — Z8709 Personal history of other diseases of the respiratory system: Secondary | ICD-10-CM | POA: Diagnosis not present

## 2018-09-15 ENCOUNTER — Emergency Department (HOSPITAL_COMMUNITY)
Admission: EM | Admit: 2018-09-15 | Discharge: 2018-09-15 | Disposition: A | Discharge status: Home / Self Care | Payer: BLUE CROSS/BLUE SHIELD | Attending: Emergency Medicine | Admitting: Emergency Medicine

## 2018-09-15 ENCOUNTER — Encounter (HOSPITAL_COMMUNITY): Payer: Self-pay | Admitting: Emergency Medicine

## 2018-09-15 ENCOUNTER — Other Ambulatory Visit: Payer: Self-pay

## 2018-09-15 ENCOUNTER — Emergency Department (HOSPITAL_COMMUNITY): Payer: BLUE CROSS/BLUE SHIELD

## 2018-09-15 DIAGNOSIS — R55 Syncope and collapse: Secondary | ICD-10-CM | POA: Diagnosis not present

## 2018-09-15 DIAGNOSIS — S161XXA Strain of muscle, fascia and tendon at neck level, initial encounter: Secondary | ICD-10-CM | POA: Diagnosis not present

## 2018-09-15 DIAGNOSIS — F419 Anxiety disorder, unspecified: Secondary | ICD-10-CM | POA: Diagnosis not present

## 2018-09-15 DIAGNOSIS — M79651 Pain in right thigh: Secondary | ICD-10-CM | POA: Insufficient documentation

## 2018-09-15 DIAGNOSIS — J45909 Unspecified asthma, uncomplicated: Secondary | ICD-10-CM | POA: Diagnosis not present

## 2018-09-15 DIAGNOSIS — Y9389 Activity, other specified: Secondary | ICD-10-CM | POA: Insufficient documentation

## 2018-09-15 DIAGNOSIS — W19XXXA Unspecified fall, initial encounter: Secondary | ICD-10-CM | POA: Insufficient documentation

## 2018-09-15 DIAGNOSIS — Y990 Civilian activity done for income or pay: Secondary | ICD-10-CM | POA: Diagnosis not present

## 2018-09-15 DIAGNOSIS — S0990XA Unspecified injury of head, initial encounter: Secondary | ICD-10-CM | POA: Diagnosis not present

## 2018-09-15 DIAGNOSIS — R52 Pain, unspecified: Secondary | ICD-10-CM | POA: Diagnosis not present

## 2018-09-15 DIAGNOSIS — S199XXA Unspecified injury of neck, initial encounter: Secondary | ICD-10-CM | POA: Diagnosis not present

## 2018-09-15 DIAGNOSIS — Z79899 Other long term (current) drug therapy: Secondary | ICD-10-CM | POA: Insufficient documentation

## 2018-09-15 DIAGNOSIS — R42 Dizziness and giddiness: Secondary | ICD-10-CM | POA: Diagnosis not present

## 2018-09-15 DIAGNOSIS — Y9289 Other specified places as the place of occurrence of the external cause: Secondary | ICD-10-CM | POA: Insufficient documentation

## 2018-09-15 DIAGNOSIS — Z87890 Personal history of sex reassignment: Secondary | ICD-10-CM | POA: Diagnosis not present

## 2018-09-15 HISTORY — DX: Bipolar disorder, unspecified: F31.9

## 2018-09-15 LAB — I-STAT CHEM 8, ED
BUN: 17 mg/dL (ref 6–20)
CHLORIDE: 106 mmol/L (ref 98–111)
Calcium, Ion: 1.13 mmol/L — ABNORMAL LOW (ref 1.15–1.40)
Creatinine, Ser: 0.8 mg/dL (ref 0.44–1.00)
GLUCOSE: 100 mg/dL — AB (ref 70–99)
HEMATOCRIT: 41 % (ref 36.0–46.0)
Hemoglobin: 13.9 g/dL (ref 12.0–15.0)
Potassium: 4.9 mmol/L (ref 3.5–5.1)
SODIUM: 136 mmol/L (ref 135–145)
TCO2: 25 mmol/L (ref 22–32)

## 2018-09-15 LAB — CBC WITH DIFFERENTIAL/PLATELET
Abs Immature Granulocytes: 0.02 10*3/uL (ref 0.00–0.07)
Basophils Absolute: 0 10*3/uL (ref 0.0–0.1)
Basophils Relative: 0 %
EOS PCT: 4 %
Eosinophils Absolute: 0.4 10*3/uL (ref 0.0–0.5)
HEMATOCRIT: 42.2 % (ref 36.0–46.0)
HEMOGLOBIN: 13.4 g/dL (ref 12.0–15.0)
Immature Granulocytes: 0 %
Lymphocytes Relative: 21 %
Lymphs Abs: 1.9 10*3/uL (ref 0.7–4.0)
MCH: 26.2 pg (ref 26.0–34.0)
MCHC: 31.8 g/dL (ref 30.0–36.0)
MCV: 82.4 fL (ref 80.0–100.0)
MONOS PCT: 5 %
Monocytes Absolute: 0.5 10*3/uL (ref 0.1–1.0)
NEUTROS ABS: 6.3 10*3/uL (ref 1.7–7.7)
NEUTROS PCT: 70 %
Platelets: 402 10*3/uL — ABNORMAL HIGH (ref 150–400)
RBC: 5.12 MIL/uL — ABNORMAL HIGH (ref 3.87–5.11)
RDW: 14.4 % (ref 11.5–15.5)
WBC: 9.1 10*3/uL (ref 4.0–10.5)
nRBC: 0 % (ref 0.0–0.2)

## 2018-09-15 LAB — CBG MONITORING, ED: Glucose-Capillary: 92 mg/dL (ref 70–99)

## 2018-09-15 MED ORDER — SODIUM CHLORIDE 0.9 % IV BOLUS
1000.0000 mL | Freq: Once | INTRAVENOUS | Status: AC
  Administered 2018-09-15: 1000 mL via INTRAVENOUS

## 2018-09-15 NOTE — ED Triage Notes (Addendum)
Per EMS: pt was at work, standing, had a syncopal episode, fell backwards. Pt reports she was down for 1 minute.  Pt does not remember the occurrence.  Pt states she has a history of psychogenic syncope, but reports no feelings of stress.  Pt also notes she is currently being treated for a left ear infection with amoxicillin.  Pt c/o neck pain.

## 2018-09-15 NOTE — ED Notes (Signed)
Pt returns from ct family at bedside

## 2018-09-15 NOTE — ED Notes (Signed)
Pt reports dizziness x1 week.

## 2018-09-15 NOTE — ED Notes (Signed)
Pt and husband states they understands instructions. Home stable with steady gait.

## 2018-09-15 NOTE — ED Provider Notes (Signed)
[facility] Provider Note   CSN: [id number] Arrival date & time: 09/15/18  [id number]     History   Chief Complaint No chief complaint on file.   HPI Janet Tucker is a 33 y.o. adult.  HPI Patient presents with syncope.  States she was at work at the [employer] and she felt a little weird and passed out backwards.  Has been feeling dizzy for the last week.  No chest pain.  No trouble breathing.  Does have pain in her right neck now.  Has been on amoxicillin for a left ear infection.  Has had decreased oral intake.  No fevers.  No numbness or weakness.  No chest pain.  No swelling in her legs.  She is transgendered and is on hormone replacement.  History of psychogenic syncope states she did not feel stressed today specifically but is overall under a lot of stress and looking for disability. Past Medical History:  Diagnosis Date  . Anxiety   . Asthma   . Bipolar 1 disorder (HCC)   . Psychogenic syncope   . PTSD (post-traumatic stress disorder)     There are no active problems to display for this patient.   Past Surgical History:  Procedure Laterality Date  . Gender Confirmation   2019     OB History   No obstetric history on file.      Home Medications    Prior to Admission medications   Medication Sig Start Date End Date Taking? Authorizing Provider  ALPRAZolam Prudy Feeler(XANAX) 1 MG tablet Take 1 mg by mouth at bedtime as needed for anxiety.   Yes [provider]  amoxicillin (AMOXIL) 500 MG tablet Take 500 mg by mouth 2 (two) times daily.   Yes [provider]  ARIPiprazole (ABILIFY) 5 MG tablet Take 5 mg by mouth daily.   Yes [provider]  benzonatate (TESSALON) 100 MG capsule Take 100 mg by mouth 3 (three) times daily as needed for cough.   Yes [provider]  buPROPion (WELLBUTRIN XL) 300 MG 24 hr tablet Take 300 mg by mouth daily.   Yes [provider]  estradiol (ESTRACE) 2 MG tablet  Take 2 mg by mouth See admin instructions. Take 1 tablet (2 mg totally) by mouth in the morning and 1/2 tablet (1 mg totally) by mouth at night   Yes [provider]  gabapentin (NEURONTIN) 800 MG tablet Take 800 mg by mouth 2 (two) times daily.    Yes [provider]  HYDROcodone-homatropine (HYCODAN) 5-1.5 MG/5ML syrup Take 5 mLs by mouth every 6 (six) hours as needed for cough.   Yes [provider]  lactulose (CHRONULAC) 10 GM/15ML solution Take 10 g by mouth daily as needed for mild constipation.   Yes [provider]  lamoTRIgine (LAMICTAL) 100 MG tablet Take 100 mg by mouth daily.   Yes [provider]  lithium carbonate (LITHOBID) 300 MG CR tablet Take 300 mg by mouth 2 (two) times daily.   Yes [provider]  metroNIDAZOLE (METROGEL) 0.75 % vaginal gel Place 1 Applicatorful vaginally 2 (two) times daily.   Yes [provider]  zolpidem (AMBIEN) 5 MG tablet Take 5 mg by mouth at bedtime as needed for sleep.   Yes [provider]    Family History History reviewed. No pertinent family history.  Social History Social History   Tobacco Use  . Smoking status: Never Smoker  . Smokeless tobacco: Never Used  Substance Use Topics  . Alcohol use: Never    Frequency: Never  . Drug use: Never     Allergies   Patient has no allergy information on record.   Review of Systems Review of Systems  Constitutional: Positive for appetite change. Negative for fatigue.  HENT: Negative for congestion.   Respiratory: Negative for shortness of breath.   Cardiovascular: Negative for chest pain.  Gastrointestinal: Negative for abdominal pain.  Genitourinary: Negative for enuresis.  Musculoskeletal: Positive for neck pain.  Skin: Negative for rash.  Neurological: Positive for syncope.  Psychiatric/Behavioral: The patient is nervous/anxious.      Physical Exam Updated Vital Signs BP (!) 118/53 (BP Location: Right Arm)    Pulse 77   Temp 98.7 F (37.1 C) (Oral)   Resp 20   Ht 5\' 6"  (1.676 m)   Wt 104.3 kg   SpO2 99%   BMI 37.12 kg/m   Physical Exam Constitutional:      Appearance: Normal appearance.  HENT:     Head: Atraumatic.     Nose: Nose normal.  Eyes:     Extraocular Movements: Extraocular movements intact.  Neck:     Comments: Right-sided lateral neck musculature tenderness.  No midline tenderness. Cardiovascular:     Rate and Rhythm: Normal rate and regular rhythm.  Abdominal:     Tenderness: There is no abdominal tenderness.  Musculoskeletal:        General: No tenderness.  Skin:    General: Skin is warm.     Capillary Refill: Capillary refill takes less than 2 seconds.  Neurological:     General: No focal deficit present.     Mental Status: He is alert.  Psychiatric:        Mood and Affect: Mood normal.      ED Treatments / Results  Labs (all labs ordered are listed, but only abnormal results are displayed) Labs Reviewed  CBC WITH DIFFERENTIAL/PLATELET - Abnormal; Notable for the following components:      Result Value   RBC 5.12 (*)    Platelets 402 (*)    All other components within normal limits  I-STAT CHEM 8, ED - Abnormal; Notable for the following components:   Glucose, Bld 100 (*)    Calcium, Ion 1.13 (*)    All other components within normal limits  CBG MONITORING, ED    EKG EKG Interpretation  Date/Time:  Saturday September 15 2018 09:45:59 EST Ventricular Rate:  82 PR Interval:    QRS Duration: 89 QT Interval:  383 QTC Calculation: 448 R Axis:   71 Text Interpretation:  Sinus rhythm Confirmed by Benjiman CorePickering, Amrit Cress [phone]) on 09/15/2018 10:38:58 AM   Radiology Ct Head Wo Contrast  Result Date: 09/15/2018 CLINICAL DATA:  33 year old female with headache status post syncope and fall hitting the back of her head. EXAM: CT HEAD WITHOUT CONTRAST TECHNIQUE: Contiguous axial images were obtained from the base of the skull through the vertex without  intravenous contrast. COMPARISON:  Prior head CT 02/07/2014 FINDINGS: Brain: No evidence of acute infarction, hemorrhage, hydrocephalus, extra-axial collection or mass lesion/mass effect. Vascular: No hyperdense vessel or unexpected calcification. Skull: Normal. Negative for fracture or focal lesion. Sinuses/Orbits: No acute finding. Other: None. IMPRESSION: Negative head CT. Electronically Signed   By: Malachy MoanHeath  McCullough M.D.   On: 09/15/2018 10:34    Procedures Procedures (including critical care time)  Medications Ordered in ED Medications  sodium chloride 0.9 % bolus 1,000 mL (1,000 mLs Intravenous New Bag/Given 09/15/18  1052)     Initial Impression / Assessment and Plan / ED Course  I have reviewed the triage vital signs and the nursing notes.  Pertinent labs & imaging results that were available during my care of the patient were reviewed by me and considered in my medical decision making (see chart for details).    Patient with syncopal episode.  Has had previous reportedly psychogenic syncopal episodes in the past.  Some lateral neck pain but do not think need further imaging at this time.  EKG reassuring.  Enzymes negative.  Overall I think she is low risk.  Fluid bolus given.  Discharge home.  Final Clinical Impressions(s) / ED Diagnoses   Final diagnoses:  Syncope, unspecified syncope type  Strain of neck muscle, initial encounter    ED Discharge Orders    None       Benjiman Core, MD 09/15/18 1159

## 2018-09-15 NOTE — ED Notes (Signed)
Pt c/o slight 1/10 pian at right thigh with palpation. No brusing swelling or redness noted at site.

## 2018-09-17 ENCOUNTER — Encounter (HOSPITAL_COMMUNITY): Payer: Self-pay | Admitting: Emergency Medicine

## 2018-09-20 ENCOUNTER — Encounter: Payer: Self-pay | Admitting: Family Medicine

## 2018-09-20 DIAGNOSIS — F3181 Bipolar II disorder: Secondary | ICD-10-CM | POA: Diagnosis not present

## 2018-09-20 DIAGNOSIS — F4310 Post-traumatic stress disorder, unspecified: Secondary | ICD-10-CM | POA: Diagnosis not present

## 2018-09-20 DIAGNOSIS — F4010 Social phobia, unspecified: Secondary | ICD-10-CM | POA: Diagnosis not present

## 2018-09-24 ENCOUNTER — Ambulatory Visit: Payer: BLUE CROSS/BLUE SHIELD | Admitting: Family Medicine

## 2018-09-27 DIAGNOSIS — F4010 Social phobia, unspecified: Secondary | ICD-10-CM | POA: Diagnosis not present

## 2018-09-27 DIAGNOSIS — F3181 Bipolar II disorder: Secondary | ICD-10-CM | POA: Diagnosis not present

## 2018-09-27 DIAGNOSIS — F4310 Post-traumatic stress disorder, unspecified: Secondary | ICD-10-CM | POA: Diagnosis not present

## 2018-10-04 ENCOUNTER — Telehealth: Payer: Self-pay | Admitting: Family Medicine

## 2018-10-04 DIAGNOSIS — F4310 Post-traumatic stress disorder, unspecified: Secondary | ICD-10-CM | POA: Diagnosis not present

## 2018-10-04 DIAGNOSIS — F3181 Bipolar II disorder: Secondary | ICD-10-CM | POA: Diagnosis not present

## 2018-10-04 DIAGNOSIS — F4010 Social phobia, unspecified: Secondary | ICD-10-CM | POA: Diagnosis not present

## 2018-10-04 NOTE — Telephone Encounter (Signed)
Called and spoke with pt regarding their appt scheduled with Dr. Clelia Croft on 10/12/18. I was able to get them rescheduled for 11/13/18 at 11:30 AM. I advised of time, building number and late policy. Pt acknowledged.

## 2018-10-11 DIAGNOSIS — F4310 Post-traumatic stress disorder, unspecified: Secondary | ICD-10-CM | POA: Diagnosis not present

## 2018-10-11 DIAGNOSIS — F4010 Social phobia, unspecified: Secondary | ICD-10-CM | POA: Diagnosis not present

## 2018-10-11 DIAGNOSIS — F3181 Bipolar II disorder: Secondary | ICD-10-CM | POA: Diagnosis not present

## 2018-10-12 ENCOUNTER — Ambulatory Visit: Payer: Self-pay | Admitting: Family Medicine

## 2018-10-23 DIAGNOSIS — F3181 Bipolar II disorder: Secondary | ICD-10-CM | POA: Diagnosis not present

## 2018-10-23 DIAGNOSIS — F4310 Post-traumatic stress disorder, unspecified: Secondary | ICD-10-CM | POA: Diagnosis not present

## 2018-10-23 DIAGNOSIS — F4010 Social phobia, unspecified: Secondary | ICD-10-CM | POA: Diagnosis not present

## 2018-10-30 DIAGNOSIS — F4310 Post-traumatic stress disorder, unspecified: Secondary | ICD-10-CM | POA: Diagnosis not present

## 2018-10-30 DIAGNOSIS — F4010 Social phobia, unspecified: Secondary | ICD-10-CM | POA: Diagnosis not present

## 2018-10-30 DIAGNOSIS — F3181 Bipolar II disorder: Secondary | ICD-10-CM | POA: Diagnosis not present

## 2018-11-07 DIAGNOSIS — F3181 Bipolar II disorder: Secondary | ICD-10-CM | POA: Diagnosis not present

## 2018-11-07 DIAGNOSIS — F4310 Post-traumatic stress disorder, unspecified: Secondary | ICD-10-CM | POA: Diagnosis not present

## 2018-11-07 DIAGNOSIS — F4010 Social phobia, unspecified: Secondary | ICD-10-CM | POA: Diagnosis not present

## 2018-11-09 ENCOUNTER — Telehealth: Payer: Self-pay | Admitting: Family Medicine

## 2018-11-09 DIAGNOSIS — R740 Nonspecific elevation of levels of transaminase and lactic acid dehydrogenase [LDH]: Secondary | ICD-10-CM

## 2018-11-09 DIAGNOSIS — Z79899 Other long term (current) drug therapy: Secondary | ICD-10-CM

## 2018-11-09 DIAGNOSIS — Z79818 Long term (current) use of other agents affecting estrogen receptors and estrogen levels: Secondary | ICD-10-CM

## 2018-11-09 DIAGNOSIS — R7401 Elevation of levels of liver transaminase levels: Secondary | ICD-10-CM

## 2018-11-09 DIAGNOSIS — N898 Other specified noninflammatory disorders of vagina: Secondary | ICD-10-CM

## 2018-11-09 NOTE — Telephone Encounter (Addendum)
Called pt on Friday 2/28 as her appt with me next week was cancelled when I was taken out of clinic.  She is still having bleeding from granulation tissues at the vaginal entrance and was told by her surgeon that it might need to be treated with silver nitrate to stop - has been waiting a long time to have this done so is very upset that I will not be available for appointments due to scheduling changes and my resignation.  I will refer her to [employer] to have this done asap.  Is currently under eval for disability due to her psychogenic syncope and wondering if disability services had gotten in touch with me - reported to pt that we just send in their records - if they need to speak directly to me, generally they will let her know first.  Pt will make appt to establish with me at my new office [place] after 01/05/19.

## 2018-11-12 ENCOUNTER — Telehealth: Payer: Self-pay | Admitting: Family Medicine

## 2018-11-12 NOTE — Telephone Encounter (Signed)
Does this patient need to be seen this week??  Spoke to provider and they can get her in next week or do you want me to try somewhere else

## 2018-11-13 ENCOUNTER — Ambulatory Visit: Payer: Self-pay | Admitting: Family Medicine

## 2018-11-13 NOTE — Telephone Encounter (Signed)
Next week is great. 

## 2018-11-14 ENCOUNTER — Other Ambulatory Visit: Payer: Self-pay

## 2018-11-14 ENCOUNTER — Encounter: Payer: Self-pay | Admitting: Obstetrics and Gynecology

## 2018-11-14 ENCOUNTER — Ambulatory Visit: Payer: BLUE CROSS/BLUE SHIELD | Admitting: Obstetrics and Gynecology

## 2018-11-14 DIAGNOSIS — Z87890 Personal history of sex reassignment: Secondary | ICD-10-CM

## 2018-11-14 DIAGNOSIS — L929 Granulomatous disorder of the skin and subcutaneous tissue, unspecified: Secondary | ICD-10-CM
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
## 2018-11-15 ENCOUNTER — Encounter: Payer: Self-pay | Admitting: Obstetrics and Gynecology

## 2018-11-17 ENCOUNTER — Encounter: Payer: Self-pay | Admitting: Family Medicine

## 2018-11-20 DIAGNOSIS — F4010 Social phobia, unspecified: Secondary | ICD-10-CM | POA: Diagnosis not present

## 2018-11-20 DIAGNOSIS — F3181 Bipolar II disorder: Secondary | ICD-10-CM | POA: Diagnosis not present

## 2018-11-20 DIAGNOSIS — F4310 Post-traumatic stress disorder, unspecified: Secondary | ICD-10-CM | POA: Diagnosis not present

## 2018-11-22 DIAGNOSIS — F4310 Post-traumatic stress disorder, unspecified: Secondary | ICD-10-CM | POA: Diagnosis not present

## 2018-11-22 DIAGNOSIS — F3181 Bipolar II disorder: Secondary | ICD-10-CM | POA: Diagnosis not present

## 2018-11-22 DIAGNOSIS — F4010 Social phobia, unspecified: Secondary | ICD-10-CM | POA: Diagnosis not present

## 2018-11-26 ENCOUNTER — Ambulatory Visit: Payer: Self-pay

## 2018-11-26 DIAGNOSIS — R062 Wheezing: Secondary | ICD-10-CM | POA: Diagnosis not present

## 2018-11-26 DIAGNOSIS — J101 Influenza due to other identified influenza virus with other respiratory manifestations: Secondary | ICD-10-CM | POA: Diagnosis not present

## 2018-11-26 DIAGNOSIS — J209 Acute bronchitis, unspecified: Secondary | ICD-10-CM | POA: Diagnosis not present

## 2018-11-26 NOTE — Telephone Encounter (Signed)
Pt c/o 2 day h/o dry cough and sunjective fever began today. Pt c/o cold chills and slight body aches today. Pt c/o dark green nasal secretions. Pt has h/o asthma. Pt stated that her breathing feels "tight" and has mild SOB. Denies wheezing. Pt with slight nausea. Pt advised to go to [facility]. Care advice given and pt verbalized understanding. Pt stated she will get her husband to take her to [facility].   Reason for Disposition . Cough with cold symptoms (e.g., runny nose, postnasal drip, throat clearing)  Answer Assessment - Initial Assessment Questions 1. ONSET: "When did the cough begin?"      2 days ago 2. SEVERITY: "How bad is the cough today?"      Dry not as bad today 3. RESPIRATORY DISTRESS: "Describe your breathing."      Mild SOB hard to breathing feels "tight" pt with h/o asthma denies wheezing. 4. FEVER: "Do you have a fever?" If so, ask: "What is your temperature, how was it measured, and when did it start?"     Yes- subjective cold chills and slight body aches today the fever started  5. SPUTUM: "Describe the color of your sputum" (clear, white, yellow, green)     None- nasal with dark green cough 6. HEMOPTYSIS: "Are you coughing up any blood?" If so ask: "How much?" (flecks, streaks, tablespoons, etc.)     no 7. CARDIAC HISTORY: "Do you have any history of heart disease?" (e.g., heart attack, congestive heart failure)      No  8. LUNG HISTORY: "Do you have any history of lung disease?"  (e.g., pulmonary embolus, asthma, emphysema)     asthma 9. PE RISK FACTORS: "Do you have a history of blood clots?" (or: recent major surgery, recent prolonged travel, bedridden)     no 10. OTHER SYMPTOMS: "Do you have any other symptoms?" (e.g., runny nose, wheezing, chest pain)       Runny nose, chest feels tight and feels anxious 11. PREGNANCY: "Is there any chance you are pregnant?" "When was your last menstrual period?"       n/a 12. TRAVEL: "Have you traveled out of the country in the last  month?" (e.g., travel history, exposures)       no  Protocols used: COUGH - ACUTE PRODUCTIVE-A-AH

## 2018-11-27 NOTE — Telephone Encounter (Signed)
FYI

## 2018-12-27 DIAGNOSIS — F4010 Social phobia, unspecified: Secondary | ICD-10-CM | POA: Diagnosis not present

## 2018-12-27 DIAGNOSIS — F4310 Post-traumatic stress disorder, unspecified: Secondary | ICD-10-CM | POA: Diagnosis not present

## 2018-12-27 DIAGNOSIS — F3181 Bipolar II disorder: Secondary | ICD-10-CM | POA: Diagnosis not present

## 2019-01-21 DIAGNOSIS — F4310 Post-traumatic stress disorder, unspecified: Secondary | ICD-10-CM | POA: Diagnosis not present

## 2019-01-21 DIAGNOSIS — F3181 Bipolar II disorder: Secondary | ICD-10-CM | POA: Diagnosis not present

## 2019-01-21 DIAGNOSIS — F4010 Social phobia, unspecified: Secondary | ICD-10-CM | POA: Diagnosis not present

## 2019-01-28 DIAGNOSIS — F4010 Social phobia, unspecified: Secondary | ICD-10-CM | POA: Diagnosis not present

## 2019-01-28 DIAGNOSIS — F3181 Bipolar II disorder: Secondary | ICD-10-CM | POA: Diagnosis not present

## 2019-01-28 DIAGNOSIS — F4310 Post-traumatic stress disorder, unspecified: Secondary | ICD-10-CM | POA: Diagnosis not present

## 2019-02-04 DIAGNOSIS — F3181 Bipolar II disorder: Secondary | ICD-10-CM | POA: Diagnosis not present

## 2019-02-04 DIAGNOSIS — F4310 Post-traumatic stress disorder, unspecified: Secondary | ICD-10-CM | POA: Diagnosis not present

## 2019-02-04 DIAGNOSIS — F4010 Social phobia, unspecified: Secondary | ICD-10-CM | POA: Diagnosis not present

## 2019-02-11 DIAGNOSIS — F4010 Social phobia, unspecified: Secondary | ICD-10-CM | POA: Diagnosis not present

## 2019-02-11 DIAGNOSIS — F3181 Bipolar II disorder: Secondary | ICD-10-CM | POA: Diagnosis not present

## 2019-02-11 DIAGNOSIS — F4310 Post-traumatic stress disorder, unspecified: Secondary | ICD-10-CM | POA: Diagnosis not present

## 2019-03-05 DIAGNOSIS — F314 Bipolar disorder, current episode depressed, severe, without psychotic features: Secondary | ICD-10-CM | POA: Diagnosis not present

## 2019-03-05 DIAGNOSIS — F410 Panic disorder [episodic paroxysmal anxiety] without agoraphobia: Secondary | ICD-10-CM | POA: Diagnosis not present

## 2019-03-05 DIAGNOSIS — F4011 Social phobia, generalized: Secondary | ICD-10-CM | POA: Diagnosis not present

## 2019-03-05 DIAGNOSIS — F4312 Post-traumatic stress disorder, chronic: Secondary | ICD-10-CM | POA: Diagnosis not present

## 2019-03-15 DIAGNOSIS — R55 Syncope and collapse: Secondary | ICD-10-CM | POA: Diagnosis not present

## 2019-03-15 DIAGNOSIS — R5383 Other fatigue: Secondary | ICD-10-CM | POA: Diagnosis not present

## 2019-03-15 DIAGNOSIS — R0602 Shortness of breath: Secondary | ICD-10-CM | POA: Diagnosis not present

## 2019-03-15 DIAGNOSIS — R635 Abnormal weight gain: Secondary | ICD-10-CM | POA: Diagnosis not present

## 2019-03-20 DIAGNOSIS — F3111 Bipolar disorder, current episode manic without psychotic features, mild: Secondary | ICD-10-CM | POA: Diagnosis not present

## 2019-03-21 DIAGNOSIS — F4310 Post-traumatic stress disorder, unspecified: Secondary | ICD-10-CM | POA: Diagnosis not present

## 2019-03-21 DIAGNOSIS — F3181 Bipolar II disorder: Secondary | ICD-10-CM | POA: Diagnosis not present

## 2019-03-21 DIAGNOSIS — F4010 Social phobia, unspecified: Secondary | ICD-10-CM | POA: Diagnosis not present

## 2019-03-28 DIAGNOSIS — F3111 Bipolar disorder, current episode manic without psychotic features, mild: Secondary | ICD-10-CM | POA: Diagnosis not present

## 2019-04-04 DIAGNOSIS — F3111 Bipolar disorder, current episode manic without psychotic features, mild: Secondary | ICD-10-CM | POA: Diagnosis not present

## 2019-04-11 DIAGNOSIS — F3111 Bipolar disorder, current episode manic without psychotic features, mild: Secondary | ICD-10-CM | POA: Diagnosis not present

## 2019-04-18 DIAGNOSIS — F3111 Bipolar disorder, current episode manic without psychotic features, mild: Secondary | ICD-10-CM | POA: Diagnosis not present

## 2019-04-21 IMAGING — CT CT HEAD W/O CM
3 series · 15 of 47 positions shown, 18 images · non-contrast
Comparison: Prior head CT 02/07/2014

CLINICAL DATA: 32-year-old female with headache status post syncope
and fall hitting the back of her head.

EXAM:
CT HEAD WITHOUT CONTRAST
TECHNIQUE: Contiguous axial images were obtained from the base of the skull
through the vertex without intravenous contrast.

[Series 3: head 5.0 h30s · axial · 0.41mm/px · z∈[-39,+91]mm · 9 of 32 slices shown, 12 images]
[im 3/32  brain]
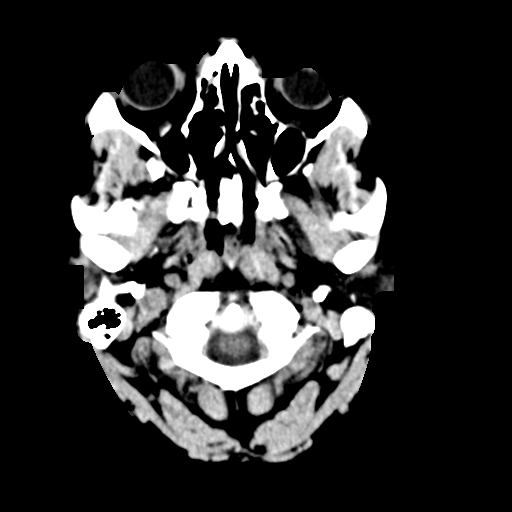
[im 3/32  bone]
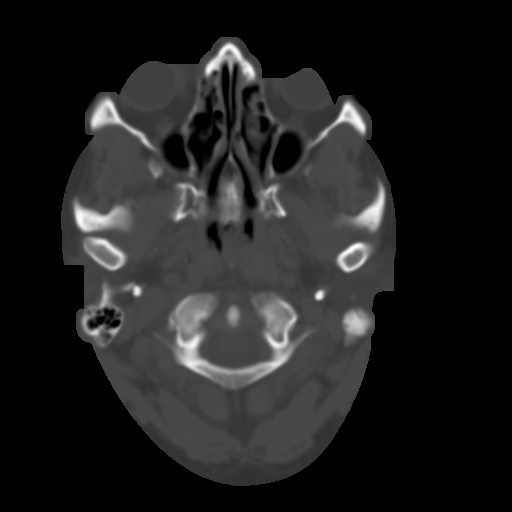
[im 6/32  brain]
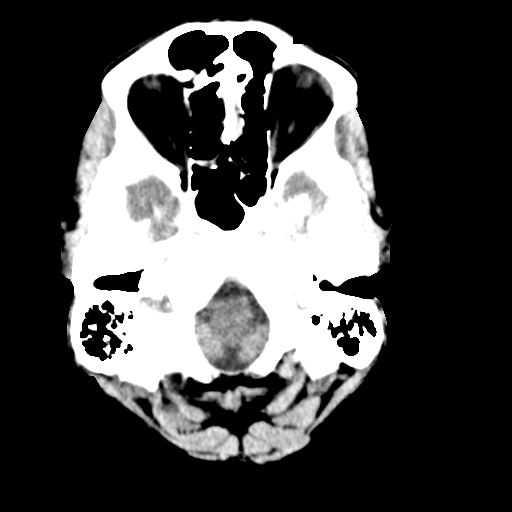
[im 9/32  brain]
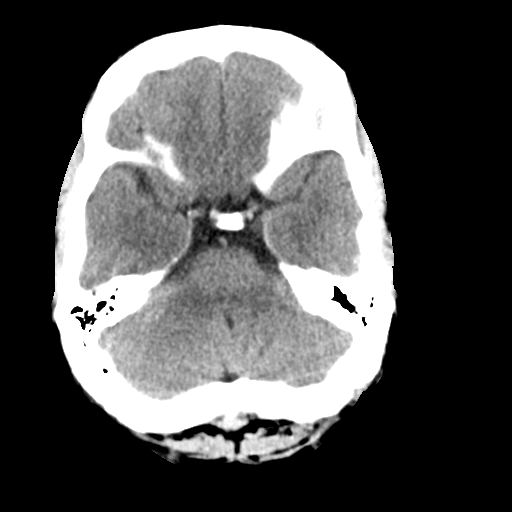
[im 12/32  brain]
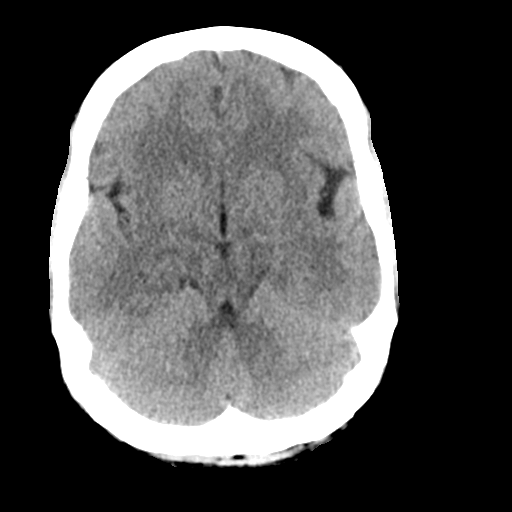
[im 17/32  brain]
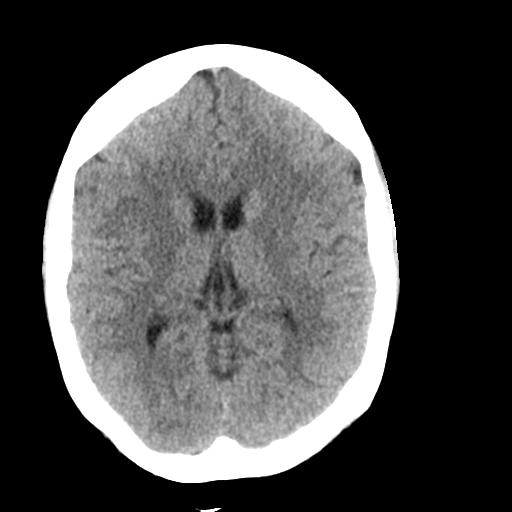
[im 17/32  bone]
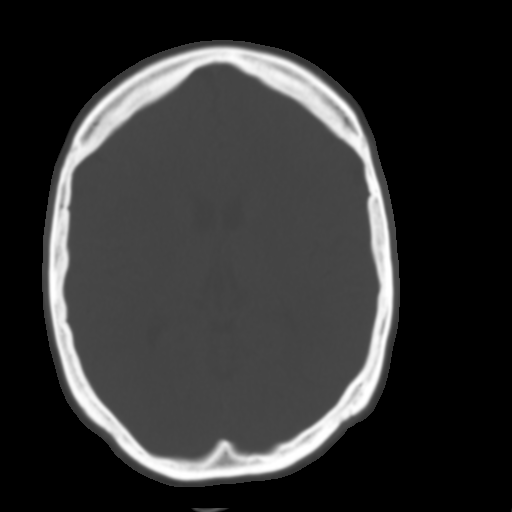
[im 20/32  brain]
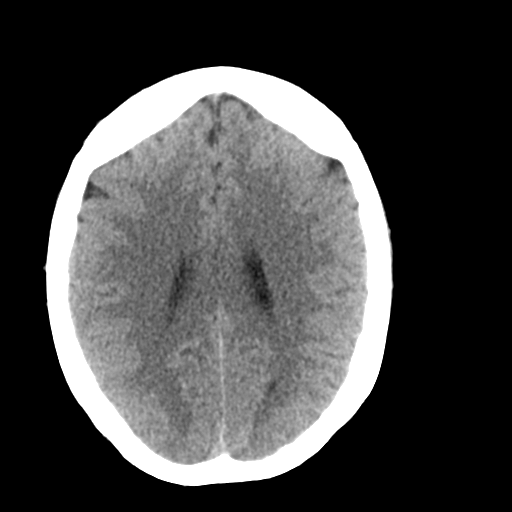
[im 23/32  brain]
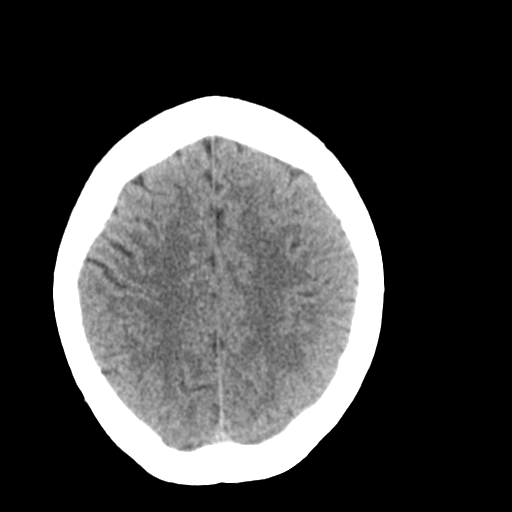
[im 26/32  brain]
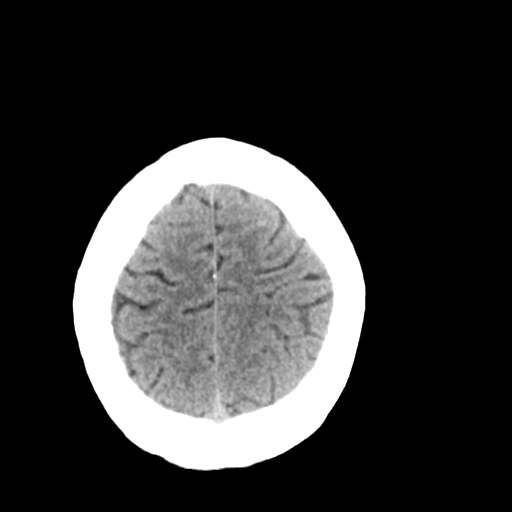
[im 29/32  brain]
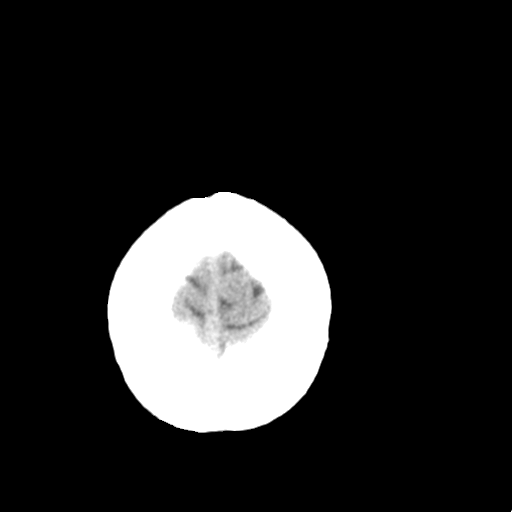
[im 29/32  bone]
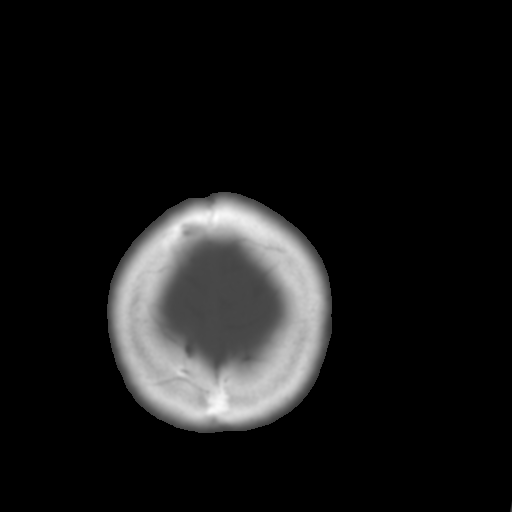

[Series 5: head 3.0 mpr cor · coronal · 0.35mm/px · 3 of 66 slices shown]
[im 22/66  brain]
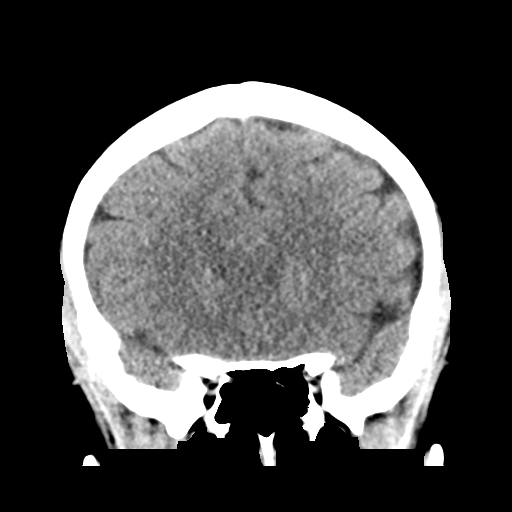
[im 29/66  brain]
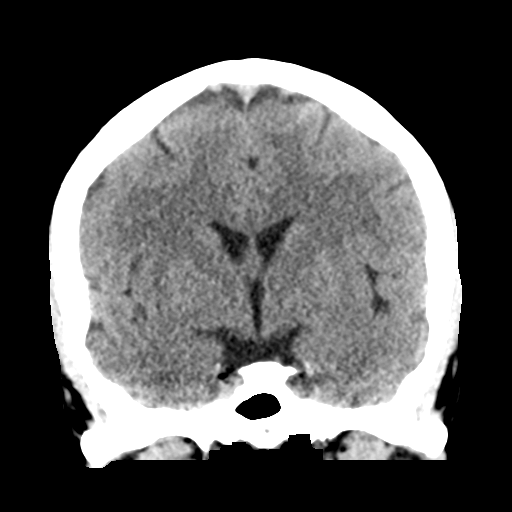
[im 37/66  brain]
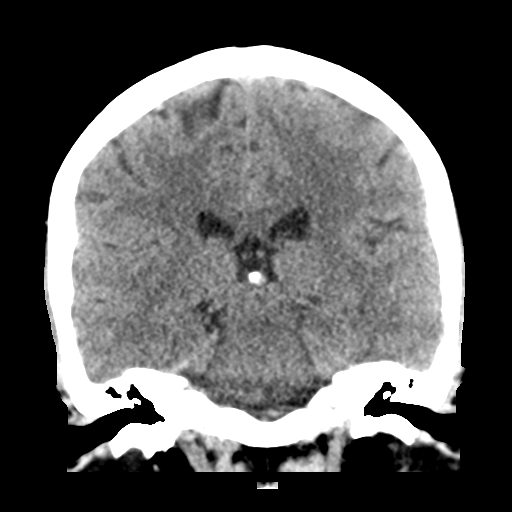

[Series 6: head 3.0 mpr sag · sagittal · 0.32mm/px · 3 of 67 slices shown]
[im 23/67  brain]
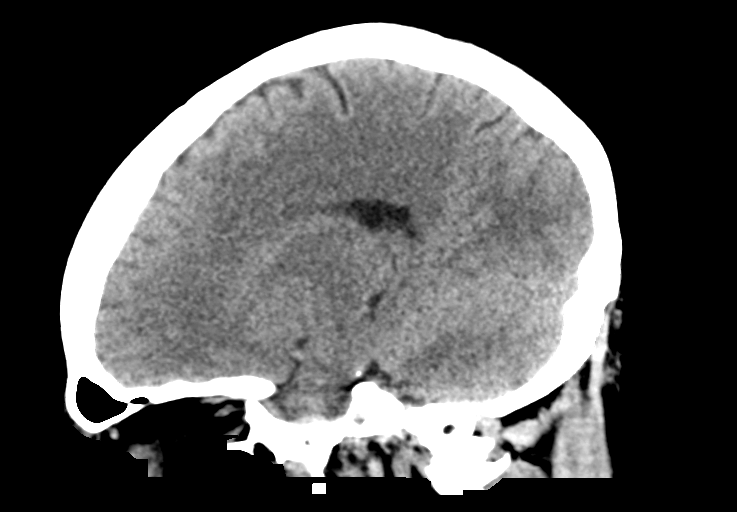
[im 34/67  brain]
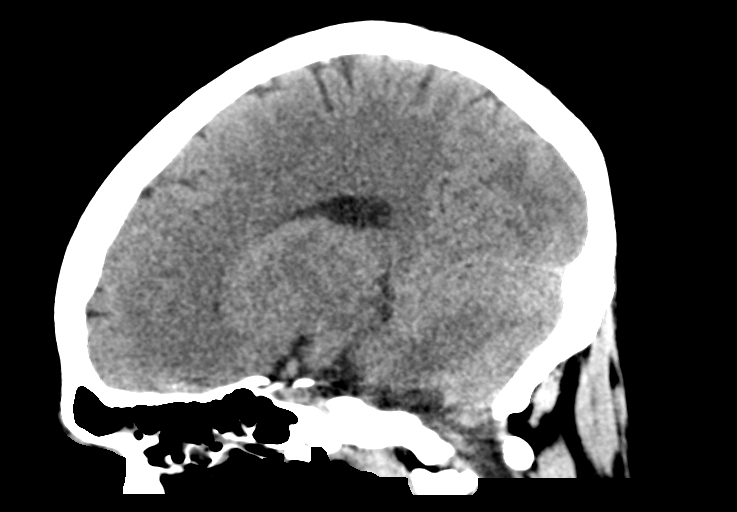
[im 45/67  brain]
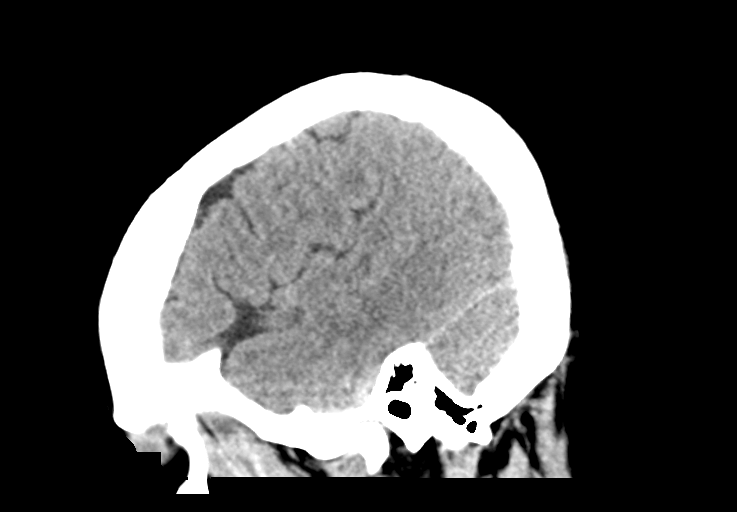

[15 of 47 positions shown; findings below may reference images not displayed]

FINDINGS: Brain: No evidence of acute infarction, hemorrhage, hydrocephalus,
extra-axial collection or mass lesion/mass effect.

Vascular: No hyperdense vessel or unexpected calcification.

Skull: Normal. Negative for fracture or focal lesion.

Sinuses/Orbits: No acute finding.

Other: None.
IMPRESSION: Negative head CT.

## 2019-04-25 DIAGNOSIS — F3111 Bipolar disorder, current episode manic without psychotic features, mild: Secondary | ICD-10-CM | POA: Diagnosis not present

## 2019-04-27 DIAGNOSIS — N10 Acute tubulo-interstitial nephritis: Secondary | ICD-10-CM | POA: Diagnosis not present

## 2019-04-30 DIAGNOSIS — N10 Acute tubulo-interstitial nephritis: Secondary | ICD-10-CM | POA: Diagnosis not present

## 2019-04-30 DIAGNOSIS — R1084 Generalized abdominal pain: Secondary | ICD-10-CM | POA: Diagnosis not present

## 2019-05-02 ENCOUNTER — Other Ambulatory Visit: Payer: Self-pay

## 2019-05-02 ENCOUNTER — Other Ambulatory Visit: Payer: Self-pay | Admitting: Family Medicine

## 2019-05-02 ENCOUNTER — Emergency Department (HOSPITAL_COMMUNITY)
Admission: EM | Admit: 2019-05-02 | Discharge: 2019-05-02 | Disposition: A | Discharge status: Home / Self Care | Payer: BC Managed Care – PPO | Attending: Emergency Medicine | Admitting: Emergency Medicine

## 2019-05-02 ENCOUNTER — Encounter (HOSPITAL_COMMUNITY): Payer: Self-pay | Admitting: Emergency Medicine

## 2019-05-02 ENCOUNTER — Ambulatory Visit
Admission: RE | Admit: 2019-05-02 | Discharge: 2019-05-02 | Disposition: A | Discharge status: Home / Self Care | Payer: BC Managed Care – PPO | Source: Ambulatory Visit | Attending: Family Medicine | Admitting: Family Medicine

## 2019-05-02 ENCOUNTER — Emergency Department (HOSPITAL_COMMUNITY): Payer: BC Managed Care – PPO

## 2019-05-02 DIAGNOSIS — R109 Unspecified abdominal pain: Secondary | ICD-10-CM

## 2019-05-02 DIAGNOSIS — K219 Gastro-esophageal reflux disease without esophagitis: Secondary | ICD-10-CM | POA: Diagnosis not present

## 2019-05-02 DIAGNOSIS — Z79899 Other long term (current) drug therapy: Secondary | ICD-10-CM | POA: Insufficient documentation

## 2019-05-02 DIAGNOSIS — J45909 Unspecified asthma, uncomplicated: Secondary | ICD-10-CM | POA: Diagnosis not present

## 2019-05-02 DIAGNOSIS — K5904 Chronic idiopathic constipation: Secondary | ICD-10-CM | POA: Diagnosis not present

## 2019-05-02 DIAGNOSIS — R1031 Right lower quadrant pain: Secondary | ICD-10-CM | POA: Insufficient documentation

## 2019-05-02 DIAGNOSIS — F410 Panic disorder [episodic paroxysmal anxiety] without agoraphobia: Secondary | ICD-10-CM | POA: Diagnosis not present

## 2019-05-02 DIAGNOSIS — J4520 Mild intermittent asthma, uncomplicated: Secondary | ICD-10-CM | POA: Diagnosis not present

## 2019-05-02 DIAGNOSIS — F314 Bipolar disorder, current episode depressed, severe, without psychotic features: Secondary | ICD-10-CM | POA: Diagnosis not present

## 2019-05-02 DIAGNOSIS — F411 Generalized anxiety disorder: Secondary | ICD-10-CM | POA: Diagnosis not present

## 2019-05-02 DIAGNOSIS — Q631 Lobulated, fused and horseshoe kidney: Secondary | ICD-10-CM | POA: Insufficient documentation

## 2019-05-02 DIAGNOSIS — F3111 Bipolar disorder, current episode manic without psychotic features, mild: Secondary | ICD-10-CM | POA: Diagnosis not present

## 2019-05-02 DIAGNOSIS — F3189 Other bipolar disorder: Secondary | ICD-10-CM | POA: Diagnosis not present

## 2019-05-02 DIAGNOSIS — F4312 Post-traumatic stress disorder, chronic: Secondary | ICD-10-CM | POA: Diagnosis not present

## 2019-05-02 LAB — COMPREHENSIVE METABOLIC PANEL
ALT: 19 U/L (ref 0–44)
AST: 16 U/L (ref 15–41)
Albumin: 4.1 g/dL (ref 3.5–5.0)
Alkaline Phosphatase: 66 U/L (ref 38–126)
Anion gap: 9 (ref 5–15)
BUN: 14 mg/dL (ref 6–20)
CO2: 25 mmol/L (ref 22–32)
Calcium: 9.4 mg/dL (ref 8.9–10.3)
Chloride: 105 mmol/L (ref 98–111)
Creatinine, Ser: 0.8 mg/dL (ref 0.44–1.00)
GFR calc Af Amer: >60 mL/min (ref >60)
GFR calc non Af Amer: >60 mL/min (ref >60)
Glucose, Bld: 93 mg/dL (ref 70–99)
Potassium: 3.7 mmol/L (ref 3.5–5.1)
Sodium: 139 mmol/L (ref 135–145)
Total Bilirubin: 0.3 mg/dL (ref 0.3–1.2)
Total Protein: 7.9 g/dL (ref 6.5–8.1)

## 2019-05-02 LAB — CBC
HCT: 43.3 % (ref 36.0–46.0)
Hemoglobin: 13.9 g/dL (ref 12.0–15.0)
MCH: 27.5 pg (ref 26.0–34.0)
MCHC: 32.1 g/dL (ref 30.0–36.0)
MCV: 85.7 fL (ref 80.0–100.0)
Platelets: 384 10*3/uL (ref 150–400)
RBC: 5.05 MIL/uL (ref 3.87–5.11)
RDW: 13.7 % (ref 11.5–15.5)
WBC: 10.5 10*3/uL (ref 4.0–10.5)
nRBC: 0 % (ref 0.0–0.2)

## 2019-05-02 LAB — LIPASE, BLOOD: Lipase: 40 U/L (ref 11–51)

## 2019-05-02 LAB — URINALYSIS, ROUTINE W REFLEX MICROSCOPIC
Bilirubin Urine: NEGATIVE
Glucose, UA: NEGATIVE mg/dL
Hgb urine dipstick: NEGATIVE
Ketones, ur: NEGATIVE mg/dL
Leukocytes,Ua: NEGATIVE
Nitrite: NEGATIVE
Protein, ur: NEGATIVE mg/dL
Specific Gravity, Urine: 1.025 (ref 1.005–1.030)
pH: 5 (ref 5.0–8.0)

## 2019-05-02 MED ORDER — MORPHINE SULFATE (PF) 4 MG/ML IV SOLN
6.0000 mg | Freq: Once | INTRAVENOUS | Status: AC
  Administered 2019-05-02: 6 mg via INTRAVENOUS
  Filled 2019-05-02: qty 2

## 2019-05-02 MED ORDER — ONDANSETRON HCL 4 MG/2ML IJ SOLN
4.0000 mg | Freq: Once | INTRAMUSCULAR | Status: AC
  Administered 2019-05-02: 4 mg via INTRAVENOUS
  Filled 2019-05-02: qty 2

## 2019-05-02 MED ORDER — IOHEXOL 300 MG/ML  SOLN
100.0000 mL | Freq: Once | INTRAMUSCULAR | Status: AC | PRN
  Administered 2019-05-02: 100 mL via INTRAVENOUS

## 2019-05-02 MED ORDER — LORAZEPAM 2 MG/ML IJ SOLN
1.0000 mg | Freq: Once | INTRAMUSCULAR | Status: AC
  Administered 2019-05-02: 1 mg via INTRAVENOUS
  Filled 2019-05-02: qty 1

## 2019-05-02 MED ORDER — SODIUM CHLORIDE (PF) 0.9 % IJ SOLN
INTRAMUSCULAR | Status: AC
  Filled 2019-05-02: qty 50

## 2019-05-02 MED ORDER — HYDROCODONE-ACETAMINOPHEN 5-325 MG PO TABS: 2.0000 | ORAL_TABLET | ORAL | 0 refills | Status: DC | PRN

## 2019-05-02 NOTE — ED Notes (Signed)
Patient has a extra gold top in the main lab 
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
## 2019-05-02 NOTE — ED Triage Notes (Signed)
Pt sent by PCP for gallstones on xray today.

## 2019-05-02 NOTE — Discharge Instructions (Signed)
The radiologist saw a horseshoe kidney on your CAT scan today.  There was no evidence of acute gallbladder infection on your labs or your CAT scan today.  You have been prescribed a course of pain medication.  Follow-up with your doctor if worse

## 2019-05-02 NOTE — ED Notes (Signed)
Patient transported to CT 

## 2019-05-06 ENCOUNTER — Other Ambulatory Visit: Payer: Self-pay | Admitting: Family Medicine

## 2019-05-06 ENCOUNTER — Other Ambulatory Visit: Payer: Self-pay

## 2019-05-06 ENCOUNTER — Ambulatory Visit
Admission: RE | Admit: 2019-05-06 | Discharge: 2019-05-06 | Disposition: A | Discharge status: Home / Self Care | Payer: BC Managed Care – PPO | Source: Ambulatory Visit | Attending: Family Medicine | Admitting: Family Medicine

## 2019-05-06 DIAGNOSIS — M545 Low back pain, unspecified: Secondary | ICD-10-CM

## 2019-05-06 DIAGNOSIS — M546 Pain in thoracic spine: Secondary | ICD-10-CM

## 2019-05-06 DIAGNOSIS — R1031 Right lower quadrant pain: Secondary | ICD-10-CM | POA: Diagnosis not present

## 2019-05-06 DIAGNOSIS — R0781 Pleurodynia: Secondary | ICD-10-CM | POA: Diagnosis not present

## 2019-05-06 DIAGNOSIS — J4520 Mild intermittent asthma, uncomplicated: Secondary | ICD-10-CM | POA: Diagnosis not present

## 2019-05-06 DIAGNOSIS — M549 Dorsalgia, unspecified: Secondary | ICD-10-CM | POA: Diagnosis not present

## 2019-05-07 DIAGNOSIS — F314 Bipolar disorder, current episode depressed, severe, without psychotic features: Secondary | ICD-10-CM | POA: Diagnosis not present

## 2019-05-07 DIAGNOSIS — E8881 Metabolic syndrome: Secondary | ICD-10-CM | POA: Diagnosis not present

## 2019-05-07 DIAGNOSIS — K219 Gastro-esophageal reflux disease without esophagitis: Secondary | ICD-10-CM | POA: Diagnosis not present

## 2019-05-07 DIAGNOSIS — J4520 Mild intermittent asthma, uncomplicated: Secondary | ICD-10-CM | POA: Diagnosis not present

## 2019-05-17 DIAGNOSIS — F3111 Bipolar disorder, current episode manic without psychotic features, mild: Secondary | ICD-10-CM | POA: Diagnosis not present

## 2019-05-17 DIAGNOSIS — F3189 Other bipolar disorder: Secondary | ICD-10-CM | POA: Diagnosis not present

## 2019-05-24 DIAGNOSIS — F3111 Bipolar disorder, current episode manic without psychotic features, mild: Secondary | ICD-10-CM | POA: Diagnosis not present

## 2019-05-24 DIAGNOSIS — F3189 Other bipolar disorder: Secondary | ICD-10-CM | POA: Diagnosis not present

## 2019-05-31 DIAGNOSIS — F3111 Bipolar disorder, current episode manic without psychotic features, mild: Secondary | ICD-10-CM | POA: Diagnosis not present

## 2019-05-31 DIAGNOSIS — F3189 Other bipolar disorder: Secondary | ICD-10-CM | POA: Diagnosis not present

## 2019-06-14 DIAGNOSIS — F331 Major depressive disorder, recurrent, moderate: Secondary | ICD-10-CM | POA: Diagnosis not present

## 2019-06-14 DIAGNOSIS — F411 Generalized anxiety disorder: Secondary | ICD-10-CM | POA: Diagnosis not present

## 2019-06-25 DIAGNOSIS — F4010 Social phobia, unspecified: Secondary | ICD-10-CM | POA: Diagnosis not present

## 2019-06-25 DIAGNOSIS — F4310 Post-traumatic stress disorder, unspecified: Secondary | ICD-10-CM | POA: Diagnosis not present

## 2019-06-25 DIAGNOSIS — F3181 Bipolar II disorder: Secondary | ICD-10-CM | POA: Diagnosis not present

## 2019-06-28 DIAGNOSIS — F331 Major depressive disorder, recurrent, moderate: Secondary | ICD-10-CM | POA: Diagnosis not present

## 2019-06-28 DIAGNOSIS — F411 Generalized anxiety disorder: Secondary | ICD-10-CM | POA: Diagnosis not present

## 2019-07-12 DIAGNOSIS — F331 Major depressive disorder, recurrent, moderate: Secondary | ICD-10-CM | POA: Diagnosis not present

## 2019-07-23 DIAGNOSIS — F331 Major depressive disorder, recurrent, moderate: Secondary | ICD-10-CM | POA: Diagnosis not present

## 2019-07-24 DIAGNOSIS — F3181 Bipolar II disorder: Secondary | ICD-10-CM | POA: Diagnosis not present

## 2019-07-24 DIAGNOSIS — F4010 Social phobia, unspecified: Secondary | ICD-10-CM | POA: Diagnosis not present

## 2019-07-24 DIAGNOSIS — F4310 Post-traumatic stress disorder, unspecified: Secondary | ICD-10-CM | POA: Diagnosis not present

## 2019-07-29 ENCOUNTER — Telehealth: Payer: Self-pay | Admitting: *Deleted

## 2019-07-29 NOTE — Telephone Encounter (Signed)
Patient in office, request OV with Dr. Talbert Nan. Patient is 1 yr post op female to female transgender. Patient denies any current GYN concerns, is unable to see surgeon at this time, only virtually. Patient is requesting OV to have Dr. Talbert Nan take a look and "make sure everything is ok". OV scheduled for 11/18 at 4:30pm with Dr. Talbert Nan. [id number] prescreen negative.   Patient declines OV on 11/17.   Routing to provider for final review. Patient is agreeable to disposition. Will close encounter.

## 2019-07-31 ENCOUNTER — Ambulatory Visit: Payer: BC Managed Care – PPO | Admitting: Obstetrics and Gynecology

## 2019-07-31 ENCOUNTER — Other Ambulatory Visit: Payer: Self-pay

## 2019-07-31 ENCOUNTER — Encounter: Payer: Self-pay | Admitting: Obstetrics and Gynecology

## 2019-07-31 VITALS — BP 130/76 | HR 88 | Temp 97.7°F | Wt 245.4 lb

## 2019-07-31 DIAGNOSIS — N898 Other specified noninflammatory disorders of vagina: Secondary | ICD-10-CM

## 2019-07-31 DIAGNOSIS — Z87890 Personal history of sex reassignment: Secondary | ICD-10-CM
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
## 2019-08-05 LAB — BACTERIAL VAGINOSIS, NAA

## 2019-08-12 DIAGNOSIS — F4010 Social phobia, unspecified: Secondary | ICD-10-CM | POA: Diagnosis not present

## 2019-08-12 DIAGNOSIS — F3181 Bipolar II disorder: Secondary | ICD-10-CM | POA: Diagnosis not present

## 2019-08-12 DIAGNOSIS — F4310 Post-traumatic stress disorder, unspecified: Secondary | ICD-10-CM | POA: Diagnosis not present

## 2019-09-14 DIAGNOSIS — L723 Sebaceous cyst: Secondary | ICD-10-CM | POA: Diagnosis not present

## 2019-09-14 DIAGNOSIS — J039 Acute tonsillitis, unspecified: Secondary | ICD-10-CM | POA: Diagnosis not present

## 2019-09-14 DIAGNOSIS — L049 Acute lymphadenitis, unspecified: Secondary | ICD-10-CM | POA: Diagnosis not present

## 2019-09-19 DIAGNOSIS — M9902 Segmental and somatic dysfunction of thoracic region: Secondary | ICD-10-CM | POA: Diagnosis not present

## 2019-09-19 DIAGNOSIS — M9901 Segmental and somatic dysfunction of cervical region: Secondary | ICD-10-CM | POA: Diagnosis not present

## 2019-09-19 DIAGNOSIS — I891 Lymphangitis: Secondary | ICD-10-CM | POA: Diagnosis not present

## 2019-09-19 DIAGNOSIS — J4520 Mild intermittent asthma, uncomplicated: Secondary | ICD-10-CM | POA: Diagnosis not present

## 2019-09-19 DIAGNOSIS — J351 Hypertrophy of tonsils: Secondary | ICD-10-CM | POA: Diagnosis not present

## 2019-09-19 DIAGNOSIS — M542 Cervicalgia: Secondary | ICD-10-CM | POA: Diagnosis not present

## 2019-09-19 DIAGNOSIS — M546 Pain in thoracic spine: Secondary | ICD-10-CM | POA: Diagnosis not present

## 2019-09-19 DIAGNOSIS — K219 Gastro-esophageal reflux disease without esophagitis: Secondary | ICD-10-CM | POA: Diagnosis not present

## 2019-09-19 DIAGNOSIS — J0391 Acute recurrent tonsillitis, unspecified: Secondary | ICD-10-CM | POA: Diagnosis not present

## 2019-09-20 DIAGNOSIS — F331 Major depressive disorder, recurrent, moderate: Secondary | ICD-10-CM | POA: Diagnosis not present

## 2019-09-24 DIAGNOSIS — J020 Streptococcal pharyngitis: Secondary | ICD-10-CM | POA: Diagnosis not present

## 2019-09-24 DIAGNOSIS — J358 Other chronic diseases of tonsils and adenoids: Secondary | ICD-10-CM | POA: Diagnosis not present

## 2019-09-24 DIAGNOSIS — R599 Enlarged lymph nodes, unspecified: Secondary | ICD-10-CM | POA: Diagnosis not present

## 2019-09-25 DIAGNOSIS — M9902 Segmental and somatic dysfunction of thoracic region: Secondary | ICD-10-CM | POA: Diagnosis not present

## 2019-09-25 DIAGNOSIS — M546 Pain in thoracic spine: Secondary | ICD-10-CM | POA: Diagnosis not present

## 2019-09-25 DIAGNOSIS — M9901 Segmental and somatic dysfunction of cervical region: Secondary | ICD-10-CM | POA: Diagnosis not present

## 2019-09-25 DIAGNOSIS — M542 Cervicalgia: Secondary | ICD-10-CM | POA: Diagnosis not present

## 2019-09-26 DIAGNOSIS — M542 Cervicalgia: Secondary | ICD-10-CM | POA: Diagnosis not present

## 2019-09-26 DIAGNOSIS — M9902 Segmental and somatic dysfunction of thoracic region: Secondary | ICD-10-CM | POA: Diagnosis not present

## 2019-09-26 DIAGNOSIS — M9901 Segmental and somatic dysfunction of cervical region: Secondary | ICD-10-CM | POA: Diagnosis not present

## 2019-09-26 DIAGNOSIS — M546 Pain in thoracic spine: Secondary | ICD-10-CM | POA: Diagnosis not present

## 2019-10-01 DIAGNOSIS — M542 Cervicalgia: Secondary | ICD-10-CM | POA: Diagnosis not present

## 2019-10-01 DIAGNOSIS — M9902 Segmental and somatic dysfunction of thoracic region: Secondary | ICD-10-CM | POA: Diagnosis not present

## 2019-10-01 DIAGNOSIS — M546 Pain in thoracic spine: Secondary | ICD-10-CM | POA: Diagnosis not present

## 2019-10-01 DIAGNOSIS — M9901 Segmental and somatic dysfunction of cervical region: Secondary | ICD-10-CM | POA: Diagnosis not present

## 2019-10-02 DIAGNOSIS — J4520 Mild intermittent asthma, uncomplicated: Secondary | ICD-10-CM | POA: Diagnosis not present

## 2019-10-02 DIAGNOSIS — K219 Gastro-esophageal reflux disease without esophagitis: Secondary | ICD-10-CM | POA: Diagnosis not present

## 2019-10-02 DIAGNOSIS — J351 Hypertrophy of tonsils: Secondary | ICD-10-CM | POA: Diagnosis not present

## 2019-10-02 DIAGNOSIS — L720 Epidermal cyst: Secondary | ICD-10-CM | POA: Diagnosis not present

## 2019-10-02 DIAGNOSIS — I891 Lymphangitis: Secondary | ICD-10-CM | POA: Diagnosis not present

## 2019-10-09 DIAGNOSIS — L720 Epidermal cyst: Secondary | ICD-10-CM | POA: Diagnosis not present

## 2019-10-09 DIAGNOSIS — M7989 Other specified soft tissue disorders: Secondary | ICD-10-CM | POA: Diagnosis not present

## 2019-10-16 DIAGNOSIS — F3181 Bipolar II disorder: Secondary | ICD-10-CM | POA: Diagnosis not present

## 2019-10-16 DIAGNOSIS — F4010 Social phobia, unspecified: Secondary | ICD-10-CM | POA: Diagnosis not present

## 2019-10-16 DIAGNOSIS — F4310 Post-traumatic stress disorder, unspecified: Secondary | ICD-10-CM | POA: Diagnosis not present

## 2019-10-18 DIAGNOSIS — F331 Major depressive disorder, recurrent, moderate: Secondary | ICD-10-CM | POA: Diagnosis not present

## 2019-11-01 DIAGNOSIS — K219 Gastro-esophageal reflux disease without esophagitis: Secondary | ICD-10-CM | POA: Diagnosis not present

## 2019-11-01 DIAGNOSIS — F331 Major depressive disorder, recurrent, moderate: Secondary | ICD-10-CM | POA: Diagnosis not present

## 2019-11-01 DIAGNOSIS — F411 Generalized anxiety disorder: Secondary | ICD-10-CM | POA: Diagnosis not present

## 2019-11-01 DIAGNOSIS — F640 Transsexualism: Secondary | ICD-10-CM | POA: Diagnosis not present

## 2019-11-01 DIAGNOSIS — F314 Bipolar disorder, current episode depressed, severe, without psychotic features: Secondary | ICD-10-CM | POA: Diagnosis not present

## 2019-11-29 DIAGNOSIS — F331 Major depressive disorder, recurrent, moderate: Secondary | ICD-10-CM | POA: Diagnosis not present

## 2019-12-13 DIAGNOSIS — F331 Major depressive disorder, recurrent, moderate: Secondary | ICD-10-CM | POA: Diagnosis not present

## 2019-12-16 DIAGNOSIS — F410 Panic disorder [episodic paroxysmal anxiety] without agoraphobia: Secondary | ICD-10-CM | POA: Diagnosis not present

## 2019-12-16 DIAGNOSIS — F4011 Social phobia, generalized: Secondary | ICD-10-CM | POA: Diagnosis not present

## 2019-12-16 DIAGNOSIS — F4312 Post-traumatic stress disorder, chronic: Secondary | ICD-10-CM | POA: Diagnosis not present

## 2019-12-16 DIAGNOSIS — F319 Bipolar disorder, unspecified: Secondary | ICD-10-CM | POA: Diagnosis not present

## 2020-01-10 DIAGNOSIS — F331 Major depressive disorder, recurrent, moderate: Secondary | ICD-10-CM | POA: Diagnosis not present

## 2020-02-10 DIAGNOSIS — F4011 Social phobia, generalized: Secondary | ICD-10-CM | POA: Diagnosis not present

## 2020-02-10 DIAGNOSIS — F4312 Post-traumatic stress disorder, chronic: Secondary | ICD-10-CM | POA: Diagnosis not present

## 2020-02-10 DIAGNOSIS — F410 Panic disorder [episodic paroxysmal anxiety] without agoraphobia: Secondary | ICD-10-CM | POA: Diagnosis not present

## 2020-02-10 DIAGNOSIS — F319 Bipolar disorder, unspecified: Secondary | ICD-10-CM | POA: Diagnosis not present

## 2020-02-18 DIAGNOSIS — L719 Rosacea, unspecified: Secondary | ICD-10-CM | POA: Diagnosis not present

## 2020-02-18 DIAGNOSIS — F4312 Post-traumatic stress disorder, chronic: Secondary | ICD-10-CM | POA: Diagnosis not present

## 2020-02-18 DIAGNOSIS — R634 Abnormal weight loss: Secondary | ICD-10-CM | POA: Diagnosis not present

## 2020-02-18 DIAGNOSIS — E2839 Other primary ovarian failure: Secondary | ICD-10-CM | POA: Diagnosis not present

## 2020-02-18 DIAGNOSIS — F314 Bipolar disorder, current episode depressed, severe, without psychotic features: Secondary | ICD-10-CM | POA: Diagnosis not present

## 2020-02-18 DIAGNOSIS — F411 Generalized anxiety disorder: Secondary | ICD-10-CM | POA: Diagnosis not present

## 2020-02-18 DIAGNOSIS — F5103 Paradoxical insomnia: Secondary | ICD-10-CM | POA: Diagnosis not present

## 2020-02-18 DIAGNOSIS — F640 Transsexualism: Secondary | ICD-10-CM | POA: Diagnosis not present

## 2020-02-21 DIAGNOSIS — J4520 Mild intermittent asthma, uncomplicated: Secondary | ICD-10-CM | POA: Diagnosis not present

## 2020-02-21 DIAGNOSIS — K5904 Chronic idiopathic constipation: Secondary | ICD-10-CM | POA: Diagnosis not present

## 2020-02-21 DIAGNOSIS — R5383 Other fatigue: Secondary | ICD-10-CM | POA: Diagnosis not present

## 2020-02-21 DIAGNOSIS — R1011 Right upper quadrant pain: Secondary | ICD-10-CM | POA: Diagnosis not present

## 2020-02-21 DIAGNOSIS — M545 Low back pain: Secondary | ICD-10-CM | POA: Diagnosis not present

## 2020-02-21 DIAGNOSIS — Z79818 Long term (current) use of other agents affecting estrogen receptors and estrogen levels: Secondary | ICD-10-CM | POA: Diagnosis not present

## 2020-02-21 DIAGNOSIS — R102 Pelvic and perineal pain: Secondary | ICD-10-CM | POA: Diagnosis not present

## 2020-02-21 DIAGNOSIS — K219 Gastro-esophageal reflux disease without esophagitis: Secondary | ICD-10-CM | POA: Diagnosis not present

## 2020-03-02 DIAGNOSIS — R05 Cough: Secondary | ICD-10-CM | POA: Diagnosis not present

## 2020-03-02 DIAGNOSIS — R0602 Shortness of breath: Secondary | ICD-10-CM | POA: Diagnosis not present

## 2020-03-02 DIAGNOSIS — J029 Acute pharyngitis, unspecified: Secondary | ICD-10-CM | POA: Diagnosis not present

## 2020-03-02 DIAGNOSIS — R062 Wheezing: Secondary | ICD-10-CM | POA: Diagnosis not present

## 2020-03-06 ENCOUNTER — Encounter: Payer: Self-pay | Admitting: Emergency Medicine

## 2020-03-06 ENCOUNTER — Ambulatory Visit (INDEPENDENT_AMBULATORY_CARE_PROVIDER_SITE_OTHER): Payer: BC Managed Care – PPO

## 2020-03-06 ENCOUNTER — Other Ambulatory Visit: Payer: Self-pay

## 2020-03-06 ENCOUNTER — Ambulatory Visit: Payer: BC Managed Care – PPO | Admitting: Emergency Medicine

## 2020-03-06 VITALS — BP 130/78 | HR 85 | Temp 98.9°F | Ht 66.0 in | Wt 231.8 lb

## 2020-03-06 DIAGNOSIS — R0602 Shortness of breath: Secondary | ICD-10-CM

## 2020-03-06 DIAGNOSIS — I270 Primary pulmonary hypertension: Secondary | ICD-10-CM | POA: Diagnosis not present

## 2020-03-06 NOTE — Patient Instructions (Signed)
We will perform full pulmonary function testing in next office visit We will check a chest x-ray today We will order an echocardiogram to review next time. Follow with Dr. Delton Coombes next available with full pulmonary function testing on the same day.
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
## 2020-03-06 NOTE — Assessment & Plan Note (Signed)
She carries a history of asthma but some of her symptoms are inconsistent.  In particular she does not respond to albuterol.  She has chronic sinus drainage and some upper airway irritation.  It is possible that this is driving her dyspnea, has been confused with asthma in the past.  Also consider some degree of restriction although she has lost weight over the last 6 months.  She is on phentermine so I think we need to at least consider the possibility of occult pulmonary hypertension.  We will check an echocardiogram to screen for elevated pulmonary pressures.  We will perform full pulmonary function testing in next office visit We will check a chest x-ray today We will order an echocardiogram to review next time. Follow with Dr. Delton Coombes next available with full pulmonary function testing on the same day.

## 2020-03-13 ENCOUNTER — Ambulatory Visit (HOSPITAL_COMMUNITY)
Admission: RE | Admit: 2020-03-13 | Discharge: 2020-03-13 | Disposition: A | Discharge status: Home / Self Care | Payer: BC Managed Care – PPO | Source: Ambulatory Visit | Attending: Emergency Medicine | Admitting: Emergency Medicine

## 2020-03-13 ENCOUNTER — Other Ambulatory Visit: Payer: Self-pay

## 2020-03-13 DIAGNOSIS — K219 Gastro-esophageal reflux disease without esophagitis: Secondary | ICD-10-CM | POA: Diagnosis not present

## 2020-03-13 DIAGNOSIS — R55 Syncope and collapse: Secondary | ICD-10-CM | POA: Diagnosis not present

## 2020-03-13 DIAGNOSIS — R0602 Shortness of breath: Secondary | ICD-10-CM | POA: Diagnosis not present

## 2020-03-13 DIAGNOSIS — I272 Pulmonary hypertension, unspecified: Secondary | ICD-10-CM | POA: Diagnosis not present

## 2020-03-13 DIAGNOSIS — I270 Primary pulmonary hypertension: Secondary | ICD-10-CM

## 2020-03-13 NOTE — Progress Notes (Signed)
  Echocardiogram 2D Echocardiogram has been performed.  Burnard Hawthorne 03/13/2020, 2:38 PM

## 2020-03-22 DIAGNOSIS — R300 Dysuria: Secondary | ICD-10-CM | POA: Diagnosis not present

## 2020-03-22 DIAGNOSIS — N3001 Acute cystitis with hematuria: Secondary | ICD-10-CM | POA: Diagnosis not present

## 2020-03-26 DIAGNOSIS — F4011 Social phobia, generalized: Secondary | ICD-10-CM | POA: Diagnosis not present

## 2020-03-26 DIAGNOSIS — F410 Panic disorder [episodic paroxysmal anxiety] without agoraphobia: Secondary | ICD-10-CM | POA: Diagnosis not present

## 2020-03-26 DIAGNOSIS — F4312 Post-traumatic stress disorder, chronic: Secondary | ICD-10-CM | POA: Diagnosis not present

## 2020-03-26 DIAGNOSIS — F319 Bipolar disorder, unspecified: Secondary | ICD-10-CM | POA: Diagnosis not present

## 2020-05-04 ENCOUNTER — Encounter: Payer: Self-pay | Admitting: Emergency Medicine

## 2020-05-04 ENCOUNTER — Other Ambulatory Visit: Payer: Self-pay

## 2020-05-04 ENCOUNTER — Ambulatory Visit (INDEPENDENT_AMBULATORY_CARE_PROVIDER_SITE_OTHER): Payer: Medicare Other | Admitting: Emergency Medicine

## 2020-05-04 DIAGNOSIS — R0602 Shortness of breath: Secondary | ICD-10-CM

## 2020-05-04 LAB — PULMONARY FUNCTION TEST
DL/VA % pred: 101 %
DL/VA: 4.53 ml/min/mmHg/L
DLCO cor % pred: 108 %
DLCO cor: 25.8 ml/min/mmHg
DLCO unc % pred: 108 %
DLCO unc: 25.8 ml/min/mmHg
FEF 25-75 Post: 3.88 L/sec
FEF 25-75 Pre: 3.3 L/sec
FEF2575-%Change-Post: 17 %
FEF2575-%Pred-Post: 111 %
FEF2575-%Pred-Pre: 94 %
FEV1-%Change-Post: 7 %
FEV1-%Pred-Post: 111 %
FEV1-%Pred-Pre: 104 %
FEV1-Post: 3.71 L
FEV1-Pre: 3.47 L
FEV1FVC-%Change-Post: 1 %
FEV1FVC-%Pred-Pre: 95 %
FEV6-%Change-Post: 5 %
FEV6-%Pred-Post: 115 %
FEV6-%Pred-Pre: 108 %
FEV6-Post: 4.56 L
FEV6-Pre: 4.3 L
FEV6FVC-%Pred-Post: 101 %
FEV6FVC-%Pred-Pre: 101 %
FVC-%Change-Post: 5 %
FVC-%Pred-Post: 113 %
FVC-%Pred-Pre: 107 %
FVC-Post: 4.56 L
FVC-Pre: 4.33 L
Post FEV1/FVC ratio: 81 %
Post FEV6/FVC ratio: 100 %
Pre FEV1/FVC ratio: 80 %
Pre FEV6/FVC Ratio: 100 %
RV % pred: 81 %
RV: 1.25 L
TLC % pred: 106 %
TLC: 5.69 L
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
## 2020-05-04 NOTE — Patient Instructions (Signed)
Your pulmonary function testing is normal without any evidence for asthma.  This is good news Your echocardiogram does not show any evidence of pulmonary hypertension associated with phentermine use.  This is good news.  I suspect that your shortness of breath is due to some degree of deconditioning.  Continue to work hard on your conditioning and weight loss.  If you continue to have symptoms even on your exercise routine then we will need to consider referring you for a cardiopulmonary exercise test to investigate further. Please call our office and arrange to see Dr. Juriel Cid if your breathing does not improve as you increase your exercise. 

## 2020-05-04 NOTE — Progress Notes (Signed)
Full PFT performed today. °

## 2020-05-04 NOTE — Assessment & Plan Note (Signed)
Your pulmonary function testing is normal without any evidence for asthma.  This is good news Your echocardiogram does not show any evidence of pulmonary hypertension associated with phentermine use.  This is good news.  I suspect that your shortness of breath is due to some degree of deconditioning.  Continue to work hard on your conditioning and weight loss.  If you continue to have symptoms even on your exercise routine then we will need to consider referring you for a cardiopulmonary exercise test to investigate further. Please call our office and arrange to see Dr. Delton Coombes if your breathing does not improve as you increase your exercise.

## 2020-07-22 DIAGNOSIS — F4312 Post-traumatic stress disorder, chronic: Secondary | ICD-10-CM | POA: Diagnosis not present

## 2020-07-22 DIAGNOSIS — F319 Bipolar disorder, unspecified: Secondary | ICD-10-CM | POA: Diagnosis not present

## 2020-07-22 DIAGNOSIS — F4011 Social phobia, generalized: Secondary | ICD-10-CM | POA: Diagnosis not present

## 2020-07-22 DIAGNOSIS — F410 Panic disorder [episodic paroxysmal anxiety] without agoraphobia: Secondary | ICD-10-CM | POA: Diagnosis not present

## 2020-08-08 DIAGNOSIS — F4312 Post-traumatic stress disorder, chronic: Secondary | ICD-10-CM | POA: Diagnosis not present

## 2020-08-08 DIAGNOSIS — F319 Bipolar disorder, unspecified: Secondary | ICD-10-CM | POA: Diagnosis not present

## 2020-08-08 DIAGNOSIS — F4011 Social phobia, generalized: Secondary | ICD-10-CM | POA: Diagnosis not present

## 2020-08-08 DIAGNOSIS — F410 Panic disorder [episodic paroxysmal anxiety] without agoraphobia: Secondary | ICD-10-CM | POA: Diagnosis not present

## 2020-08-10 DIAGNOSIS — F4011 Social phobia, generalized: Secondary | ICD-10-CM | POA: Diagnosis not present

## 2020-08-10 DIAGNOSIS — F410 Panic disorder [episodic paroxysmal anxiety] without agoraphobia: Secondary | ICD-10-CM | POA: Diagnosis not present

## 2020-08-10 DIAGNOSIS — F4312 Post-traumatic stress disorder, chronic: Secondary | ICD-10-CM | POA: Diagnosis not present

## 2020-08-10 DIAGNOSIS — F319 Bipolar disorder, unspecified: Secondary | ICD-10-CM | POA: Diagnosis not present

## 2020-08-19 DIAGNOSIS — R5383 Other fatigue: Secondary | ICD-10-CM | POA: Diagnosis not present

## 2020-08-19 DIAGNOSIS — Z79818 Long term (current) use of other agents affecting estrogen receptors and estrogen levels: Secondary | ICD-10-CM | POA: Diagnosis not present

## 2020-08-19 DIAGNOSIS — R55 Syncope and collapse: Secondary | ICD-10-CM | POA: Diagnosis not present

## 2020-08-19 DIAGNOSIS — K5904 Chronic idiopathic constipation: Secondary | ICD-10-CM | POA: Diagnosis not present

## 2020-08-26 DIAGNOSIS — H9203 Otalgia, bilateral: Secondary | ICD-10-CM | POA: Diagnosis not present

## 2020-08-26 DIAGNOSIS — F411 Generalized anxiety disorder: Secondary | ICD-10-CM | POA: Diagnosis not present

## 2020-08-26 DIAGNOSIS — R1084 Generalized abdominal pain: Secondary | ICD-10-CM | POA: Diagnosis not present

## 2020-08-26 DIAGNOSIS — F410 Panic disorder [episodic paroxysmal anxiety] without agoraphobia: Secondary | ICD-10-CM | POA: Diagnosis not present

## 2020-09-10 DIAGNOSIS — F410 Panic disorder [episodic paroxysmal anxiety] without agoraphobia: Secondary | ICD-10-CM | POA: Diagnosis not present

## 2020-09-10 DIAGNOSIS — F319 Bipolar disorder, unspecified: Secondary | ICD-10-CM | POA: Diagnosis not present

## 2020-09-10 DIAGNOSIS — G4700 Insomnia, unspecified: Secondary | ICD-10-CM | POA: Diagnosis not present

## 2020-09-10 DIAGNOSIS — F4312 Post-traumatic stress disorder, chronic: Secondary | ICD-10-CM | POA: Diagnosis not present

## 2020-09-14 DIAGNOSIS — F4312 Post-traumatic stress disorder, chronic: Secondary | ICD-10-CM | POA: Diagnosis not present

## 2020-09-14 DIAGNOSIS — F4011 Social phobia, generalized: Secondary | ICD-10-CM | POA: Diagnosis not present

## 2020-09-14 DIAGNOSIS — F319 Bipolar disorder, unspecified: Secondary | ICD-10-CM | POA: Diagnosis not present

## 2020-09-14 DIAGNOSIS — F410 Panic disorder [episodic paroxysmal anxiety] without agoraphobia: Secondary | ICD-10-CM | POA: Diagnosis not present

## 2020-09-28 DIAGNOSIS — F410 Panic disorder [episodic paroxysmal anxiety] without agoraphobia: Secondary | ICD-10-CM | POA: Diagnosis not present

## 2020-09-28 DIAGNOSIS — F319 Bipolar disorder, unspecified: Secondary | ICD-10-CM | POA: Diagnosis not present

## 2020-09-28 DIAGNOSIS — F4011 Social phobia, generalized: Secondary | ICD-10-CM | POA: Diagnosis not present

## 2020-09-28 DIAGNOSIS — F4312 Post-traumatic stress disorder, chronic: Secondary | ICD-10-CM | POA: Diagnosis not present

## 2020-10-02 DIAGNOSIS — F4312 Post-traumatic stress disorder, chronic: Secondary | ICD-10-CM | POA: Diagnosis not present

## 2020-10-02 DIAGNOSIS — F410 Panic disorder [episodic paroxysmal anxiety] without agoraphobia: Secondary | ICD-10-CM | POA: Diagnosis not present

## 2020-10-02 DIAGNOSIS — G4700 Insomnia, unspecified: Secondary | ICD-10-CM | POA: Diagnosis not present

## 2020-10-02 DIAGNOSIS — F319 Bipolar disorder, unspecified: Secondary | ICD-10-CM | POA: Diagnosis not present

## 2020-10-13 DIAGNOSIS — F4011 Social phobia, generalized: Secondary | ICD-10-CM | POA: Diagnosis not present

## 2020-10-13 DIAGNOSIS — F4312 Post-traumatic stress disorder, chronic: Secondary | ICD-10-CM | POA: Diagnosis not present

## 2020-10-13 DIAGNOSIS — F319 Bipolar disorder, unspecified: Secondary | ICD-10-CM | POA: Diagnosis not present

## 2020-10-13 DIAGNOSIS — F410 Panic disorder [episodic paroxysmal anxiety] without agoraphobia: Secondary | ICD-10-CM | POA: Diagnosis not present

## 2020-10-19 DIAGNOSIS — R110 Nausea: Secondary | ICD-10-CM | POA: Diagnosis not present

## 2020-10-19 DIAGNOSIS — Z5321 Procedure and treatment not carried out due to patient leaving prior to being seen by health care provider: Secondary | ICD-10-CM | POA: Diagnosis not present

## 2020-10-19 DIAGNOSIS — R1013 Epigastric pain: Secondary | ICD-10-CM | POA: Diagnosis not present

## 2020-10-21 ENCOUNTER — Ambulatory Visit (HOSPITAL_COMMUNITY)
Admission: EM | Admit: 2020-10-21 | Discharge: 2020-10-22 | Disposition: A | Discharge status: Home / Self Care | Payer: Medicare Other | Attending: Surgery | Admitting: Surgery

## 2020-10-21 ENCOUNTER — Encounter (HOSPITAL_COMMUNITY): Payer: Self-pay | Admitting: Emergency Medicine

## 2020-10-21 ENCOUNTER — Other Ambulatory Visit: Payer: Self-pay

## 2020-10-21 DIAGNOSIS — K381 Appendicular concretions: Secondary | ICD-10-CM | POA: Diagnosis not present

## 2020-10-21 DIAGNOSIS — K8010 Calculus of gallbladder with chronic cholecystitis without obstruction: Secondary | ICD-10-CM | POA: Diagnosis not present

## 2020-10-21 DIAGNOSIS — K8000 Calculus of gallbladder with acute cholecystitis without obstruction: Secondary | ICD-10-CM | POA: Diagnosis not present

## 2020-10-21 DIAGNOSIS — F4310 Post-traumatic stress disorder, unspecified: Secondary | ICD-10-CM | POA: Insufficient documentation

## 2020-10-21 DIAGNOSIS — Z809 Family history of malignant neoplasm, unspecified: Secondary | ICD-10-CM | POA: Diagnosis not present

## 2020-10-21 DIAGNOSIS — K8012 Calculus of gallbladder with acute and chronic cholecystitis without obstruction: Secondary | ICD-10-CM | POA: Insufficient documentation

## 2020-10-21 DIAGNOSIS — Z79899 Other long term (current) drug therapy: Secondary | ICD-10-CM | POA: Diagnosis not present

## 2020-10-21 DIAGNOSIS — R1110 Vomiting, unspecified: Secondary | ICD-10-CM | POA: Diagnosis not present

## 2020-10-21 DIAGNOSIS — Z818 Family history of other mental and behavioral disorders: Secondary | ICD-10-CM | POA: Insufficient documentation

## 2020-10-21 DIAGNOSIS — G8929 Other chronic pain: Secondary | ICD-10-CM | POA: Diagnosis not present

## 2020-10-21 DIAGNOSIS — M549 Dorsalgia, unspecified: Secondary | ICD-10-CM | POA: Insufficient documentation

## 2020-10-21 DIAGNOSIS — F319 Bipolar disorder, unspecified: Secondary | ICD-10-CM | POA: Insufficient documentation

## 2020-10-21 DIAGNOSIS — Z800 Family history of malignant neoplasm of digestive organs: Secondary | ICD-10-CM | POA: Diagnosis not present

## 2020-10-21 DIAGNOSIS — Z8249 Family history of ischemic heart disease and other diseases of the circulatory system: Secondary | ICD-10-CM | POA: Insufficient documentation

## 2020-10-21 DIAGNOSIS — K8020 Calculus of gallbladder without cholecystitis without obstruction: Secondary | ICD-10-CM

## 2020-10-21 DIAGNOSIS — Z833 Family history of diabetes mellitus: Secondary | ICD-10-CM | POA: Diagnosis not present

## 2020-10-21 DIAGNOSIS — Z20822 Contact with and (suspected) exposure to covid-19: Secondary | ICD-10-CM | POA: Diagnosis not present

## 2020-10-21 DIAGNOSIS — Z7951 Long term (current) use of inhaled steroids: Secondary | ICD-10-CM | POA: Diagnosis not present

## 2020-10-21 DIAGNOSIS — K219 Gastro-esophageal reflux disease without esophagitis: Secondary | ICD-10-CM | POA: Diagnosis not present

## 2020-10-21 DIAGNOSIS — R1011 Right upper quadrant pain: Secondary | ICD-10-CM

## 2020-10-21 DIAGNOSIS — I10 Essential (primary) hypertension: Secondary | ICD-10-CM | POA: Diagnosis not present

## 2020-10-21 DIAGNOSIS — R1013 Epigastric pain: Secondary | ICD-10-CM | POA: Diagnosis present

## 2020-10-21 LAB — CBC
HCT: 41.6 % (ref 36.0–46.0)
Hemoglobin: 13.6 g/dL (ref 12.0–15.0)
MCH: 27.6 pg (ref 26.0–34.0)
MCHC: 32.7 g/dL (ref 30.0–36.0)
MCV: 84.6 fL (ref 80.0–100.0)
Platelets: 443 10*3/uL — ABNORMAL HIGH (ref 150–400)
RBC: 4.92 MIL/uL (ref 3.87–5.11)
RDW: 12.7 % (ref 11.5–15.5)
WBC: 12.2 10*3/uL — ABNORMAL HIGH (ref 4.0–10.5)
nRBC: 0 % (ref 0.0–0.2)

## 2020-10-21 LAB — COMPREHENSIVE METABOLIC PANEL
ALT: 15 U/L (ref 0–44)
AST: 16 U/L (ref 15–41)
Albumin: 3.6 g/dL (ref 3.5–5.0)
Alkaline Phosphatase: 52 U/L (ref 38–126)
Anion gap: 12 (ref 5–15)
BUN: 11 mg/dL (ref 6–20)
CO2: 22 mmol/L (ref 22–32)
Calcium: 9 mg/dL (ref 8.9–10.3)
Chloride: 102 mmol/L (ref 98–111)
Creatinine, Ser: 0.77 mg/dL (ref 0.44–1.00)
GFR, Estimated: >60 mL/min (ref >60)
Glucose, Bld: 100 mg/dL — ABNORMAL HIGH (ref 70–99)
Potassium: 4 mmol/L (ref 3.5–5.1)
Sodium: 136 mmol/L (ref 135–145)
Total Bilirubin: 0.4 mg/dL (ref 0.3–1.2)
Total Protein: 7.3 g/dL (ref 6.5–8.1)

## 2020-10-21 LAB — LIPASE, BLOOD: Lipase: 42 U/L (ref 11–51)

## 2020-10-21 NOTE — ED Triage Notes (Signed)
Patient reports epigastric pain with emesis onset this week , no diarrhea or fever .

## 2020-10-22 ENCOUNTER — Emergency Department (HOSPITAL_COMMUNITY): Payer: Medicare Other | Admitting: Certified Registered"

## 2020-10-22 ENCOUNTER — Encounter (HOSPITAL_COMMUNITY)
Admission: EM | Disposition: A | Discharge status: Home / Self Care | Payer: Self-pay | Source: Home / Self Care | Attending: Emergency Medicine

## 2020-10-22 ENCOUNTER — Emergency Department (HOSPITAL_COMMUNITY): Payer: Medicare Other

## 2020-10-22 ENCOUNTER — Encounter (HOSPITAL_COMMUNITY): Payer: Self-pay | Admitting: Certified Registered"

## 2020-10-22 DIAGNOSIS — F319 Bipolar disorder, unspecified: Secondary | ICD-10-CM | POA: Diagnosis not present

## 2020-10-22 DIAGNOSIS — K8010 Calculus of gallbladder with chronic cholecystitis without obstruction: Secondary | ICD-10-CM | POA: Diagnosis not present

## 2020-10-22 DIAGNOSIS — K8000 Calculus of gallbladder with acute cholecystitis without obstruction: Secondary | ICD-10-CM | POA: Diagnosis not present

## 2020-10-22 DIAGNOSIS — K8012 Calculus of gallbladder with acute and chronic cholecystitis without obstruction: Secondary | ICD-10-CM | POA: Diagnosis not present

## 2020-10-22 DIAGNOSIS — Z20822 Contact with and (suspected) exposure to covid-19: Secondary | ICD-10-CM | POA: Diagnosis not present

## 2020-10-22 DIAGNOSIS — K8020 Calculus of gallbladder without cholecystitis without obstruction: Secondary | ICD-10-CM | POA: Diagnosis not present

## 2020-10-22 DIAGNOSIS — R1110 Vomiting, unspecified: Secondary | ICD-10-CM | POA: Diagnosis not present

## 2020-10-22 DIAGNOSIS — I10 Essential (primary) hypertension: Secondary | ICD-10-CM | POA: Diagnosis not present

## 2020-10-22 DIAGNOSIS — K381 Appendicular concretions: Secondary | ICD-10-CM | POA: Diagnosis not present

## 2020-10-22 HISTORY — PX: CHOLECYSTECTOMY: SHX55

## 2020-10-22 LAB — URINALYSIS, ROUTINE W REFLEX MICROSCOPIC
Bilirubin Urine: NEGATIVE
Glucose, UA: NEGATIVE mg/dL
Hgb urine dipstick: NEGATIVE
Ketones, ur: NEGATIVE mg/dL
Leukocytes,Ua: NEGATIVE
Nitrite: NEGATIVE
Protein, ur: NEGATIVE mg/dL
Specific Gravity, Urine: 1.015 (ref 1.005–1.030)
pH: 5 (ref 5.0–8.0)

## 2020-10-22 LAB — RESP PANEL BY RT-PCR (FLU A&B, COVID) ARPGX2
Influenza A by PCR: NEGATIVE
Influenza B by PCR: NEGATIVE
SARS Coronavirus 2 by RT PCR: NEGATIVE

## 2020-10-22 SURGERY — LAPAROSCOPIC CHOLECYSTECTOMY
Anesthesia: General | Site: Abdomen

## 2020-10-22 MED ORDER — OXYCODONE HCL 5 MG PO TABS: 5.0000 mg | ORAL_TABLET | Freq: Four times a day (QID) | ORAL | 0 refills | Status: DC | PRN

## 2020-10-22 MED ORDER — SUGAMMADEX SODIUM 200 MG/2ML IV SOLN
INTRAVENOUS | Status: DC | PRN
  Administered 2020-10-22: 200 mg via INTRAVENOUS

## 2020-10-22 MED ORDER — IBUPROFEN 800 MG PO TABS
800.0000 mg | ORAL_TABLET | Freq: Three times a day (TID) | ORAL | 1 refills | Status: DC
Start: 2020-10-22 — End: 2022-06-03

## 2020-10-22 MED ORDER — CEFAZOLIN SODIUM 1 G IJ SOLR
INTRAMUSCULAR | Status: AC
  Filled 2020-10-22: qty 20

## 2020-10-22 MED ORDER — MIDAZOLAM HCL 2 MG/2ML IJ SOLN
INTRAMUSCULAR | Status: AC
  Filled 2020-10-22: qty 2

## 2020-10-22 MED ORDER — ONDANSETRON HCL 4 MG/2ML IJ SOLN
INTRAMUSCULAR | Status: AC
  Filled 2020-10-22: qty 2

## 2020-10-22 MED ORDER — LIDOCAINE 2% (20 MG/ML) 5 ML SYRINGE
INTRAMUSCULAR | Status: DC | PRN
  Administered 2020-10-22: 40 mg via INTRAVENOUS

## 2020-10-22 MED ORDER — OXYCODONE HCL 5 MG/5ML PO SOLN: 5.0000 mg | Freq: Once | ORAL | Status: AC | PRN

## 2020-10-22 MED ORDER — FENTANYL CITRATE (PF) 100 MCG/2ML IJ SOLN: 25.0000 ug | INTRAMUSCULAR | Status: DC | PRN

## 2020-10-22 MED ORDER — OXYCODONE HCL 5 MG PO TABS
ORAL_TABLET | ORAL | Status: AC
  Filled 2020-10-22: qty 1

## 2020-10-22 MED ORDER — SODIUM CHLORIDE 0.9 % IR SOLN
Status: DC | PRN
  Administered 2020-10-22: 1000 mL

## 2020-10-22 MED ORDER — PROPOFOL 10 MG/ML IV BOLUS
INTRAVENOUS | Status: AC
  Filled 2020-10-22: qty 20

## 2020-10-22 MED ORDER — CHLORHEXIDINE GLUCONATE 0.12 % MT SOLN
OROMUCOSAL | Status: AC
  Administered 2020-10-22: 15 mL
  Filled 2020-10-22: qty 15

## 2020-10-22 MED ORDER — ONDANSETRON HCL 4 MG/2ML IJ SOLN
4.0000 mg | Freq: Once | INTRAMUSCULAR | Status: AC
  Administered 2020-10-22: 4 mg via INTRAVENOUS
  Filled 2020-10-22: qty 2

## 2020-10-22 MED ORDER — SUCCINYLCHOLINE CHLORIDE 200 MG/10ML IV SOSY
PREFILLED_SYRINGE | INTRAVENOUS | Status: DC | PRN
  Administered 2020-10-22: 140 mg via INTRAVENOUS

## 2020-10-22 MED ORDER — LACTATED RINGERS IV SOLN: INTRAVENOUS | Status: DC

## 2020-10-22 MED ORDER — DEXAMETHASONE SODIUM PHOSPHATE 10 MG/ML IJ SOLN
INTRAMUSCULAR | Status: DC | PRN
  Administered 2020-10-22: 10 mg via INTRAVENOUS

## 2020-10-22 MED ORDER — ROCURONIUM BROMIDE 10 MG/ML (PF) SYRINGE
PREFILLED_SYRINGE | INTRAVENOUS | Status: DC | PRN
  Administered 2020-10-22: 30 mg via INTRAVENOUS

## 2020-10-22 MED ORDER — FENTANYL CITRATE (PF) 100 MCG/2ML IJ SOLN
INTRAMUSCULAR | Status: DC | PRN
  Administered 2020-10-22: 75 ug via INTRAVENOUS
  Administered 2020-10-22: 50 ug via INTRAVENOUS
  Administered 2020-10-22: 125 ug via INTRAVENOUS

## 2020-10-22 MED ORDER — FENTANYL CITRATE (PF) 250 MCG/5ML IJ SOLN
INTRAMUSCULAR | Status: AC
  Filled 2020-10-22: qty 5

## 2020-10-22 MED ORDER — IOHEXOL 300 MG/ML  SOLN
100.0000 mL | Freq: Once | INTRAMUSCULAR | Status: AC | PRN
  Administered 2020-10-22: 100 mL via INTRAVENOUS

## 2020-10-22 MED ORDER — SODIUM CHLORIDE 0.9 % IV SOLN
2.0000 g | INTRAVENOUS | Status: DC
  Administered 2020-10-22: 2 g via INTRAVENOUS
  Filled 2020-10-22: qty 20

## 2020-10-22 MED ORDER — 0.9 % SODIUM CHLORIDE (POUR BTL) OPTIME
TOPICAL | Status: DC | PRN
  Administered 2020-10-22: 1000 mL

## 2020-10-22 MED ORDER — DOCUSATE SODIUM 100 MG PO CAPS: 100.0000 mg | ORAL_CAPSULE | Freq: Two times a day (BID) | ORAL | 2 refills | Status: DC

## 2020-10-22 MED ORDER — SCOPOLAMINE 1 MG/3DAYS TD PT72
1.0000 | MEDICATED_PATCH | TRANSDERMAL | Status: DC
Start: 2020-10-22 — End: 2020-10-23
  Administered 2020-10-22: 1.5 mg via TRANSDERMAL
  Filled 2020-10-22: qty 1

## 2020-10-22 MED ORDER — MORPHINE SULFATE (PF) 4 MG/ML IV SOLN
4.0000 mg | Freq: Once | INTRAVENOUS | Status: AC
  Administered 2020-10-22: 4 mg via INTRAVENOUS
  Filled 2020-10-22: qty 1

## 2020-10-22 MED ORDER — PROPOFOL 10 MG/ML IV BOLUS
INTRAVENOUS | Status: DC | PRN
  Administered 2020-10-22: 140 mg via INTRAVENOUS

## 2020-10-22 MED ORDER — CEFAZOLIN SODIUM-DEXTROSE 2-3 GM-%(50ML) IV SOLR
INTRAVENOUS | Status: DC | PRN
  Administered 2020-10-22: 2 g via INTRAVENOUS

## 2020-10-22 MED ORDER — SUCCINYLCHOLINE CHLORIDE 200 MG/10ML IV SOSY
PREFILLED_SYRINGE | INTRAVENOUS | Status: AC
  Filled 2020-10-22: qty 10

## 2020-10-22 MED ORDER — HYDROMORPHONE HCL 1 MG/ML IJ SOLN
0.2500 mg | INTRAMUSCULAR | Status: DC | PRN
  Administered 2020-10-22: 0.25 mg via INTRAVENOUS

## 2020-10-22 MED ORDER — ONDANSETRON HCL 4 MG/2ML IJ SOLN
4.0000 mg | Freq: Once | INTRAMUSCULAR | Status: AC | PRN
  Administered 2020-10-22: 4 mg via INTRAVENOUS

## 2020-10-22 MED ORDER — DOCUSATE SODIUM 100 MG PO CAPS: 100.0000 mg | ORAL_CAPSULE | Freq: Two times a day (BID) | ORAL | 2 refills | Status: AC

## 2020-10-22 MED ORDER — HYDROMORPHONE HCL 1 MG/ML IJ SOLN
INTRAMUSCULAR | Status: AC
  Filled 2020-10-22: qty 1

## 2020-10-22 MED ORDER — OXYCODONE HCL 5 MG PO TABS
5.0000 mg | ORAL_TABLET | Freq: Once | ORAL | Status: AC | PRN
  Administered 2020-10-22: 5 mg via ORAL

## 2020-10-22 MED ORDER — BUPIVACAINE HCL (PF) 0.25 % IJ SOLN
INTRAMUSCULAR | Status: AC
  Filled 2020-10-22: qty 30

## 2020-10-22 MED ORDER — LIDOCAINE-EPINEPHRINE 2 %-1:100000 IJ SOLN
INTRAMUSCULAR | Status: AC
  Filled 2020-10-22: qty 1

## 2020-10-22 MED ORDER — IBUPROFEN 800 MG PO TABS: 800.0000 mg | ORAL_TABLET | Freq: Three times a day (TID) | ORAL | 1 refills | Status: DC

## 2020-10-22 MED ORDER — ONDANSETRON HCL 4 MG/2ML IJ SOLN
INTRAMUSCULAR | Status: DC | PRN
  Administered 2020-10-22: 4 mg via INTRAVENOUS

## 2020-10-22 MED ORDER — MIDAZOLAM HCL 5 MG/5ML IJ SOLN
INTRAMUSCULAR | Status: DC | PRN
  Administered 2020-10-22: 2 mg via INTRAVENOUS

## 2020-10-22 MED ORDER — BUPIVACAINE HCL 0.25 % IJ SOLN
INTRAMUSCULAR | Status: DC | PRN
  Administered 2020-10-22: 10 mL

## 2020-10-22 MED ORDER — KETOROLAC TROMETHAMINE 15 MG/ML IJ SOLN
15.0000 mg | INTRAMUSCULAR | Status: AC
Start: 2020-10-22 — End: 2020-10-22
  Administered 2020-10-22: 15 mg via INTRAVENOUS
  Filled 2020-10-22: qty 1

## 2020-10-22 MED ORDER — LIDOCAINE HCL 2 % IJ SOLN
INTRAMUSCULAR | Status: DC | PRN
  Administered 2020-10-22: 10 mL

## 2020-10-22 SURGICAL SUPPLY — 39 items
APPLIER CLIP 5 13 M/L LIGAMAX5 (MISCELLANEOUS) ×3
CANISTER SUCT 3000ML PPV (MISCELLANEOUS) ×3 IMPLANT
CHLORAPREP W/TINT 26 (MISCELLANEOUS) ×3 IMPLANT
CLIP APPLIE 5 13 M/L LIGAMAX5 (MISCELLANEOUS) ×2 IMPLANT
COVER MAYO STAND STRL (DRAPES) ×3 IMPLANT
COVER SURGICAL LIGHT HANDLE (MISCELLANEOUS) ×3 IMPLANT
DERMABOND ADVANCED (GAUZE/BANDAGES/DRESSINGS) ×1
DERMABOND ADVANCED .7 DNX12 (GAUZE/BANDAGES/DRESSINGS) ×2 IMPLANT
DISSECTOR BLUNT TIP ENDO 5MM (MISCELLANEOUS) ×3 IMPLANT
ELECT REM PT RETURN 9FT ADLT (ELECTROSURGICAL) ×3
ELECTRODE REM PT RTRN 9FT ADLT (ELECTROSURGICAL) ×2 IMPLANT
ENDOLOOP SUT PDS II  0 18 (SUTURE) ×1
ENDOLOOP SUT PDS II 0 18 (SUTURE) ×2 IMPLANT
GLOVE BIO SURGEON STRL SZ 6.5 (GLOVE) ×3 IMPLANT
GLOVE BIOGEL PI IND STRL 6 (GLOVE) ×2 IMPLANT
GLOVE BIOGEL PI INDICATOR 6 (GLOVE) ×1
GOWN STRL REUS W/ TWL LRG LVL3 (GOWN DISPOSABLE) ×6 IMPLANT
GOWN STRL REUS W/TWL LRG LVL3 (GOWN DISPOSABLE) ×3
HEMOSTAT SURGICEL 2X14 (HEMOSTASIS) ×3 IMPLANT
KIT BASIN OR (CUSTOM PROCEDURE TRAY) ×3 IMPLANT
KIT TURNOVER KIT B (KITS) ×3 IMPLANT
NS IRRIG 1000ML POUR BTL (IV SOLUTION) ×3 IMPLANT
PAD ARMBOARD 7.5X6 YLW CONV (MISCELLANEOUS) ×3 IMPLANT
PENCIL SMOKE EVACUATOR (MISCELLANEOUS) ×3 IMPLANT
POUCH SPECIMEN RETRIEVAL 10MM (ENDOMECHANICALS) ×3 IMPLANT
SCISSORS LAP 5X35 DISP (ENDOMECHANICALS) ×3 IMPLANT
SET IRRIG TUBING LAPAROSCOPIC (IRRIGATION / IRRIGATOR) IMPLANT
SET TUBE SMOKE EVAC HIGH FLOW (TUBING) ×3 IMPLANT
SLEEVE ENDOPATH XCEL 5M (ENDOMECHANICALS) ×6 IMPLANT
SLEEVE SCD COMPRESS KNEE MED (MISCELLANEOUS) ×3 IMPLANT
SPECIMEN JAR SMALL (MISCELLANEOUS) ×3 IMPLANT
SUT MNCRL AB 4-0 PS2 18 (SUTURE) ×6 IMPLANT
SUT VIC AB 0 UR5 27 (SUTURE) ×3 IMPLANT
TOWEL GREEN STERILE (TOWEL DISPOSABLE) ×3 IMPLANT
TOWEL GREEN STERILE FF (TOWEL DISPOSABLE) ×3 IMPLANT
TRAY LAPAROSCOPIC MC (CUSTOM PROCEDURE TRAY) ×3 IMPLANT
TROCAR XCEL BLUNT TIP 100MML (ENDOMECHANICALS) ×3 IMPLANT
TROCAR XCEL NON-BLD 5MMX100MML (ENDOMECHANICALS) ×3 IMPLANT
WATER STERILE IRR 1000ML POUR (IV SOLUTION) ×3 IMPLANT

## 2020-10-22 NOTE — Discharge Instructions (Addendum)
May shower beginning 10/23/2020. Do not peel off or scrub skin glue. May allow warm soapy water to run over incision, then rinse and pat dry. Do not soak in any water (tubs, hot tubs, pools, lakes, oceans) for one week.   No lifting greater than 5 pounds for six weeks.   Pain regimen: take over-the-counter tylenol (acetaminophen) 1000mg  every six hours and the prescription ibuprofen (800mg ) every eight hours. You also have a prescription for oxycodone, which should be taken if the tylenol (acetaminophen) and ibuprofen are not enough to control your pain. You may take the oxycodone as frequently as every six hours as needed. You have also been given a prescription for colace (docusate) which is a stool softener. Please take this as prescribed because the oxycodone can cause constipation and the colace (docusate) will minimize or prevent constipation.  Call the office at [phone] for temperature greater than 101.73F, worsening pain, redness or warmth at the incision site.  You need to have your labs drawn to check your liver function on 10/26/2020 or 10/27/2020. Please call [phone] to make an appointment for 1-2 weeks after surgery for wound check.   The clinic staff is available to answer your questions during regular business hours.  Please dont hesitate to call and ask to speak to one of the nurses for clinical concerns.  If you have a medical emergency, go to the nearest emergency room or call 911.  A surgeon from [facility] is always on call at the hospital.  [address], [address], [place], [place]  [place] ? [address] [address], [place], [address]   [place] [phone] ? [phone] ? FAX [phone] Web site: [email or web address]

## 2020-10-22 NOTE — Anesthesia Preprocedure Evaluation (Signed)
Anesthesia Evaluation  Patient identified by MRN, date of birth, ID band Patient awake    Reviewed: Allergy & Precautions, NPO status , Patient's Chart, lab work & pertinent test results  Airway Mallampati: II  TM Distance: >3 FB Neck ROM: Full    Dental  (+) Teeth Intact, Dental Advisory Given   Pulmonary    breath sounds clear to auscultation       Cardiovascular  Rhythm:Regular Rate:Normal     Neuro/Psych    GI/Hepatic   Endo/Other    Renal/GU      Musculoskeletal   Abdominal   Peds  Hematology   Anesthesia Other Findings   Reproductive/Obstetrics                             Anesthesia Physical Anesthesia Plan  ASA: II  Anesthesia Plan: General   Post-op Pain Management:    Induction: Intravenous, Cricoid pressure planned and Rapid sequence  PONV Risk Score and Plan: Ondansetron and Dexamethasone  Airway Management Planned: Oral ETT  Additional Equipment:   Intra-op Plan:   Post-operative Plan: Extubation in OR  Informed Consent: I have reviewed the patients History and Physical, chart, labs and discussed the procedure including the risks, benefits and alternatives for the proposed anesthesia with the patient or authorized representative who has indicated his/her understanding and acceptance.     Dental advisory given  Plan Discussed with: CRNA and Anesthesiologist  Anesthesia Plan Comments:         Anesthesia Quick Evaluation

## 2020-10-22 NOTE — Op Note (Signed)
   Operative Note  Date: 10/22/2020  Procedure: laparoscopic cholecystectomy  Pre-op diagnosis: acute cholecystitis Post-op diagnosis: same  Indication and clinical history: The patient is a 35 y.o. year old adult with acute cholecystitis  Surgeon: Diamantina Monks, MD Assistant: Gerome Sam, MD, PGY5  Anesthesiologist: Noreene Larsson, MD Anesthesia: General  Findings:  . Specimen: gallbladder . EBL: 10cc . Drains/Implants: none  Disposition: PACU - hemodynamically stable.  Description of procedure: The patient was positioned supine on the operating room table. Time-out was performed verifying correct patient, procedure, signature of informed consent, and administration of pre-operative antibiotics. General anesthetic induction and intubation were uneventful. The abdomen was prepped and draped in the usual sterile fashion. An infra-umbilical incision was made using an open technique using zero vicryl stay sutures on either side of the fascia and a 56mm Hassan port inserted. After establishing pneumoperitoneum, which the patient tolerated well, the abdominal cavity was inspected and no injury of any intra-abdominal structures was identified. Additional ports were placed under direct visualization and using local anesthetic: two 59mm ports in the right subcostal region and a 76mm port in the epigastric region. The patient was re-positioned to reverse Trendelenburg and right side up. Adhesiolysis was performed to expose the gallbladder, which was then retracted cephalad. The infundibulum was identified and retracted toward the right lower quadrant. The peritoneum was incised over the infundibulum and the triangle of Calot dissected to expose the critical view of safety. With clear identification and isolation of the cystic duct and cystic artery, the cystic artery was doubly clipped and divided. After this, the cystic duct was identified as a single structure entering the gallbladder, and was also  doubly clipped and divided. The gallbladder was dissected off the liver bed using electrocautery and hemostasis of the liver bed was confirmed prior to separation of the final peritoneal attachments of the gallbladder to the liver bed. The gallbladder fossa was irrigated and fluid returned clear. After transection of the final peritoneal attachments, the gallbladder was placed in an endoscopic specimen retrieval bag, removed via the umbilical port site, and sent to pathology as a specimen. The gallbladder fossa was inspected confirming hemostasis, the absence of bile leakage from the cystic duct stump, and correct placement of clips on the cystic artery and cystic duct stumps. There was a small laceration to the liver that was cauterized and a piece of surgical was placed over it. The abdomen was desufflated and the fascia of the umbilical port site was closed using the previously placed stay sutures. Additional local anesthetic was administered at the umbilical port site.  The skin of all incisions was closed with 4-0 monocryl. Sterile dressings were applied. All sponge and instrument counts were correct at the conclusion of the procedure. The patient was awakened from anesthesia, extubated uneventfully, and transported to the PACU - hemodynamically stable.. There were no complications.    Diamantina Monks, MD General and Trauma Surgery [facility]

## 2020-10-22 NOTE — Transfer of Care (Signed)
Immediate Anesthesia Transfer of Care Note  Patient: Janet Tucker  Procedure(s) Performed: LAPAROSCOPIC CHOLECYSTECTOMY WITH INTRAOPERATIVE CHOLANGIOGRAM (N/A )  Patient Location: PACU  Anesthesia Type:General  Level of Consciousness: drowsy  Airway & Oxygen Therapy: Patient Spontanous Breathing and Patient connected to nasal cannula oxygen  Post-op Assessment: Report given to RN and Post -op Vital signs reviewed and stable  Post vital signs: Reviewed and stable  Last Vitals:  Vitals Value Taken Time  BP    Temp    Pulse 96 10/22/20 1741  Resp 17 10/22/20 1741  SpO2 98 % 10/22/20 1741  Vitals shown include unvalidated device data.  Last Pain:  Vitals:   10/22/20 1450  TempSrc:   PainSc: 3          Complications: No complications documented.
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
## 2020-10-22 NOTE — ED Provider Notes (Signed)
Spoke to Dr. Bedelia Person with general surgery due to concerns about COVID test. COVID tested ordered at 1213; however it looks like it was discontinued? Spoke to RN, COVID test running right now.    Mannie Stabile, PA-C 10/22/20 1513    Terald Sleeper, MD 10/22/20 [phone]

## 2020-10-22 NOTE — Anesthesia Procedure Notes (Signed)
Procedure Name: Intubation Date/Time: 10/22/2020 4:30 PM Performed by: Moshe Salisbury, CRNA Pre-anesthesia Checklist: Patient identified, Emergency Drugs available, Suction available and Patient being monitored Patient Re-evaluated:Patient Re-evaluated prior to induction Oxygen Delivery Method: Circle System Utilized Preoxygenation: Pre-oxygenation with 100% oxygen Induction Type: IV induction Ventilation: Mask ventilation without difficulty Laryngoscope Size: Mac and 4 Grade View: Grade I Tube type: Oral Tube size: 7.5 mm Number of attempts: 1 Airway Equipment and Method: Stylet Placement Confirmation: ETT inserted through vocal cords under direct vision,  positive ETCO2 and breath sounds checked- equal and bilateral Secured at: 21 cm Tube secured with: Tape Dental Injury: Teeth and Oropharynx as per pre-operative assessment
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
## 2020-10-22 NOTE — ED Notes (Signed)
Consent signed.

## 2020-10-23 ENCOUNTER — Encounter (HOSPITAL_COMMUNITY): Payer: Self-pay | Admitting: Surgery

## 2020-10-26 LAB — SURGICAL PATHOLOGY

## 2020-10-27 DIAGNOSIS — Z9049 Acquired absence of other specified parts of digestive tract: Secondary | ICD-10-CM | POA: Diagnosis not present

## 2020-10-27 NOTE — Anesthesia Postprocedure Evaluation (Signed)
Anesthesia Post Note  Patient: Janet Tucker  Procedure(s) Performed: LAPAROSCOPIC CHOLECYSTECTOMY (N/A Abdomen)     Patient location during evaluation: PACU Anesthesia Type: General Level of consciousness: awake and alert Pain management: pain level controlled Vital Signs Assessment: post-procedure vital signs reviewed and stable Respiratory status: spontaneous breathing, nonlabored ventilation, respiratory function stable and patient connected to nasal cannula oxygen Cardiovascular status: blood pressure returned to baseline and stable Postop Assessment: no apparent nausea or vomiting Anesthetic complications: no   No complications documented.  Last Vitals:  Vitals:   10/22/20 1845 10/22/20 1900  BP: 127/82 124/81  Pulse: 79 73  Resp: (!) 21 16  Temp: 36.8 C   SpO2: 96% 96%    Last Pain:  Vitals:   10/22/20 1845  TempSrc:   PainSc: Asleep                 [place]lee [place] COKER

## 2020-10-31 DIAGNOSIS — F319 Bipolar disorder, unspecified: Secondary | ICD-10-CM | POA: Diagnosis not present

## 2020-10-31 DIAGNOSIS — F410 Panic disorder [episodic paroxysmal anxiety] without agoraphobia: Secondary | ICD-10-CM | POA: Diagnosis not present

## 2020-10-31 DIAGNOSIS — G4700 Insomnia, unspecified: Secondary | ICD-10-CM | POA: Diagnosis not present

## 2020-10-31 DIAGNOSIS — F4312 Post-traumatic stress disorder, chronic: Secondary | ICD-10-CM | POA: Diagnosis not present

## 2020-11-10 DIAGNOSIS — F4011 Social phobia, generalized: Secondary | ICD-10-CM | POA: Diagnosis not present

## 2020-11-10 DIAGNOSIS — F410 Panic disorder [episodic paroxysmal anxiety] without agoraphobia: Secondary | ICD-10-CM | POA: Diagnosis not present

## 2020-11-10 DIAGNOSIS — G4700 Insomnia, unspecified: Secondary | ICD-10-CM | POA: Diagnosis not present

## 2020-11-10 DIAGNOSIS — F4312 Post-traumatic stress disorder, chronic: Secondary | ICD-10-CM | POA: Diagnosis not present

## 2020-11-18 DIAGNOSIS — F4312 Post-traumatic stress disorder, chronic: Secondary | ICD-10-CM | POA: Diagnosis not present

## 2020-11-18 DIAGNOSIS — F410 Panic disorder [episodic paroxysmal anxiety] without agoraphobia: Secondary | ICD-10-CM | POA: Diagnosis not present

## 2020-11-18 DIAGNOSIS — F411 Generalized anxiety disorder: Secondary | ICD-10-CM | POA: Diagnosis not present

## 2020-11-18 DIAGNOSIS — F515 Nightmare disorder: Secondary | ICD-10-CM | POA: Diagnosis not present

## 2020-11-28 DIAGNOSIS — F4312 Post-traumatic stress disorder, chronic: Secondary | ICD-10-CM | POA: Diagnosis not present

## 2020-11-28 DIAGNOSIS — G4700 Insomnia, unspecified: Secondary | ICD-10-CM | POA: Diagnosis not present

## 2020-11-28 DIAGNOSIS — F410 Panic disorder [episodic paroxysmal anxiety] without agoraphobia: Secondary | ICD-10-CM | POA: Diagnosis not present

## 2020-11-28 DIAGNOSIS — F319 Bipolar disorder, unspecified: Secondary | ICD-10-CM | POA: Diagnosis not present

## 2020-12-08 DIAGNOSIS — F4312 Post-traumatic stress disorder, chronic: Secondary | ICD-10-CM | POA: Diagnosis not present

## 2020-12-08 DIAGNOSIS — F4011 Social phobia, generalized: Secondary | ICD-10-CM | POA: Diagnosis not present

## 2020-12-08 DIAGNOSIS — F410 Panic disorder [episodic paroxysmal anxiety] without agoraphobia: Secondary | ICD-10-CM | POA: Diagnosis not present

## 2020-12-08 DIAGNOSIS — G4700 Insomnia, unspecified: Secondary | ICD-10-CM | POA: Diagnosis not present

## 2020-12-30 DIAGNOSIS — F4312 Post-traumatic stress disorder, chronic: Secondary | ICD-10-CM | POA: Diagnosis not present

## 2020-12-30 DIAGNOSIS — G4700 Insomnia, unspecified: Secondary | ICD-10-CM | POA: Diagnosis not present

## 2020-12-30 DIAGNOSIS — F410 Panic disorder [episodic paroxysmal anxiety] without agoraphobia: Secondary | ICD-10-CM | POA: Diagnosis not present

## 2020-12-30 DIAGNOSIS — F4011 Social phobia, generalized: Secondary | ICD-10-CM | POA: Diagnosis not present

## 2021-01-05 DIAGNOSIS — F4011 Social phobia, generalized: Secondary | ICD-10-CM | POA: Diagnosis not present

## 2021-01-05 DIAGNOSIS — F4312 Post-traumatic stress disorder, chronic: Secondary | ICD-10-CM | POA: Diagnosis not present

## 2021-01-05 DIAGNOSIS — G4700 Insomnia, unspecified: Secondary | ICD-10-CM | POA: Diagnosis not present

## 2021-01-05 DIAGNOSIS — F410 Panic disorder [episodic paroxysmal anxiety] without agoraphobia: Secondary | ICD-10-CM | POA: Diagnosis not present

## 2021-02-15 DIAGNOSIS — G4700 Insomnia, unspecified: Secondary | ICD-10-CM | POA: Diagnosis not present

## 2021-02-15 DIAGNOSIS — F4312 Post-traumatic stress disorder, chronic: Secondary | ICD-10-CM | POA: Diagnosis not present

## 2021-02-15 DIAGNOSIS — F410 Panic disorder [episodic paroxysmal anxiety] without agoraphobia: Secondary | ICD-10-CM | POA: Diagnosis not present

## 2021-02-15 DIAGNOSIS — F4011 Social phobia, generalized: Secondary | ICD-10-CM | POA: Diagnosis not present

## 2021-04-09 DIAGNOSIS — F410 Panic disorder [episodic paroxysmal anxiety] without agoraphobia: Secondary | ICD-10-CM | POA: Diagnosis not present

## 2021-04-09 DIAGNOSIS — F4312 Post-traumatic stress disorder, chronic: Secondary | ICD-10-CM | POA: Diagnosis not present

## 2021-04-09 DIAGNOSIS — F4011 Social phobia, generalized: Secondary | ICD-10-CM | POA: Diagnosis not present

## 2021-04-09 DIAGNOSIS — F319 Bipolar disorder, unspecified: Secondary | ICD-10-CM | POA: Diagnosis not present

## 2021-06-02 DIAGNOSIS — F411 Generalized anxiety disorder: Secondary | ICD-10-CM | POA: Diagnosis not present

## 2021-06-02 DIAGNOSIS — F314 Bipolar disorder, current episode depressed, severe, without psychotic features: Secondary | ICD-10-CM | POA: Diagnosis not present

## 2021-06-02 DIAGNOSIS — F410 Panic disorder [episodic paroxysmal anxiety] without agoraphobia: Secondary | ICD-10-CM | POA: Diagnosis not present

## 2021-06-02 DIAGNOSIS — F4312 Post-traumatic stress disorder, chronic: Secondary | ICD-10-CM | POA: Diagnosis not present

## 2021-07-13 DIAGNOSIS — F4312 Post-traumatic stress disorder, chronic: Secondary | ICD-10-CM | POA: Diagnosis not present

## 2021-07-13 DIAGNOSIS — F410 Panic disorder [episodic paroxysmal anxiety] without agoraphobia: Secondary | ICD-10-CM | POA: Diagnosis not present

## 2021-07-13 DIAGNOSIS — G4700 Insomnia, unspecified: Secondary | ICD-10-CM | POA: Diagnosis not present

## 2021-07-13 DIAGNOSIS — F319 Bipolar disorder, unspecified: Secondary | ICD-10-CM | POA: Diagnosis not present

## 2021-08-11 DIAGNOSIS — F410 Panic disorder [episodic paroxysmal anxiety] without agoraphobia: Secondary | ICD-10-CM | POA: Diagnosis not present

## 2021-08-11 DIAGNOSIS — G4700 Insomnia, unspecified: Secondary | ICD-10-CM | POA: Diagnosis not present

## 2021-08-11 DIAGNOSIS — F4011 Social phobia, generalized: Secondary | ICD-10-CM | POA: Diagnosis not present

## 2021-08-11 DIAGNOSIS — F4312 Post-traumatic stress disorder, chronic: Secondary | ICD-10-CM | POA: Diagnosis not present

## 2021-08-17 DIAGNOSIS — N3001 Acute cystitis with hematuria: Secondary | ICD-10-CM | POA: Diagnosis not present

## 2021-09-09 DIAGNOSIS — F410 Panic disorder [episodic paroxysmal anxiety] without agoraphobia: Secondary | ICD-10-CM | POA: Diagnosis not present

## 2021-09-09 DIAGNOSIS — G4700 Insomnia, unspecified: Secondary | ICD-10-CM | POA: Diagnosis not present

## 2021-09-09 DIAGNOSIS — F4312 Post-traumatic stress disorder, chronic: Secondary | ICD-10-CM | POA: Diagnosis not present

## 2021-09-09 DIAGNOSIS — F4011 Social phobia, generalized: Secondary | ICD-10-CM | POA: Diagnosis not present

## 2021-10-07 DIAGNOSIS — F4312 Post-traumatic stress disorder, chronic: Secondary | ICD-10-CM | POA: Diagnosis not present

## 2021-10-07 DIAGNOSIS — F4011 Social phobia, generalized: Secondary | ICD-10-CM | POA: Diagnosis not present

## 2021-10-07 DIAGNOSIS — F3181 Bipolar II disorder: Secondary | ICD-10-CM | POA: Diagnosis not present

## 2021-10-07 DIAGNOSIS — F410 Panic disorder [episodic paroxysmal anxiety] without agoraphobia: Secondary | ICD-10-CM | POA: Diagnosis not present

## 2021-10-28 DIAGNOSIS — E538 Deficiency of other specified B group vitamins: Secondary | ICD-10-CM | POA: Diagnosis not present

## 2021-10-28 DIAGNOSIS — F3181 Bipolar II disorder: Secondary | ICD-10-CM | POA: Diagnosis not present

## 2021-10-28 DIAGNOSIS — F4310 Post-traumatic stress disorder, unspecified: Secondary | ICD-10-CM | POA: Diagnosis not present

## 2021-10-28 DIAGNOSIS — E559 Vitamin D deficiency, unspecified: Secondary | ICD-10-CM | POA: Diagnosis not present

## 2021-10-29 DIAGNOSIS — E538 Deficiency of other specified B group vitamins: Secondary | ICD-10-CM | POA: Diagnosis not present

## 2021-10-29 DIAGNOSIS — Z79899 Other long term (current) drug therapy: Secondary | ICD-10-CM | POA: Diagnosis not present

## 2021-10-29 DIAGNOSIS — F640 Transsexualism: Secondary | ICD-10-CM | POA: Diagnosis not present

## 2021-10-29 DIAGNOSIS — E669 Obesity, unspecified: Secondary | ICD-10-CM | POA: Diagnosis not present

## 2021-10-29 DIAGNOSIS — E559 Vitamin D deficiency, unspecified: Secondary | ICD-10-CM | POA: Diagnosis not present

## 2021-11-11 DIAGNOSIS — F4011 Social phobia, generalized: Secondary | ICD-10-CM | POA: Diagnosis not present

## 2021-11-11 DIAGNOSIS — Z6838 Body mass index (BMI) 38.0-38.9, adult: Secondary | ICD-10-CM | POA: Diagnosis not present

## 2021-11-11 DIAGNOSIS — F3181 Bipolar II disorder: Secondary | ICD-10-CM | POA: Diagnosis not present

## 2021-11-11 DIAGNOSIS — E661 Drug-induced obesity: Secondary | ICD-10-CM | POA: Diagnosis not present

## 2021-11-11 DIAGNOSIS — F4312 Post-traumatic stress disorder, chronic: Secondary | ICD-10-CM | POA: Diagnosis not present

## 2021-11-11 DIAGNOSIS — F410 Panic disorder [episodic paroxysmal anxiety] without agoraphobia: Secondary | ICD-10-CM | POA: Diagnosis not present

## 2021-11-11 DIAGNOSIS — G4700 Insomnia, unspecified: Secondary | ICD-10-CM | POA: Diagnosis not present

## 2021-11-11 DIAGNOSIS — F4010 Social phobia, unspecified: Secondary | ICD-10-CM | POA: Diagnosis not present

## 2021-11-26 DIAGNOSIS — H5213 Myopia, bilateral: Secondary | ICD-10-CM | POA: Diagnosis not present

## 2022-01-05 DIAGNOSIS — F4312 Post-traumatic stress disorder, chronic: Secondary | ICD-10-CM | POA: Diagnosis not present

## 2022-01-05 DIAGNOSIS — F410 Panic disorder [episodic paroxysmal anxiety] without agoraphobia: Secondary | ICD-10-CM | POA: Diagnosis not present

## 2022-01-05 DIAGNOSIS — F3181 Bipolar II disorder: Secondary | ICD-10-CM | POA: Diagnosis not present

## 2022-01-05 DIAGNOSIS — F4011 Social phobia, generalized: Secondary | ICD-10-CM | POA: Diagnosis not present

## 2022-02-09 DIAGNOSIS — F410 Panic disorder [episodic paroxysmal anxiety] without agoraphobia: Secondary | ICD-10-CM | POA: Diagnosis not present

## 2022-02-09 DIAGNOSIS — F4312 Post-traumatic stress disorder, chronic: Secondary | ICD-10-CM | POA: Diagnosis not present

## 2022-02-09 DIAGNOSIS — F3181 Bipolar II disorder: Secondary | ICD-10-CM | POA: Diagnosis not present

## 2022-02-09 DIAGNOSIS — F4011 Social phobia, generalized: Secondary | ICD-10-CM | POA: Diagnosis not present

## 2022-03-10 DIAGNOSIS — F4312 Post-traumatic stress disorder, chronic: Secondary | ICD-10-CM | POA: Diagnosis not present

## 2022-03-10 DIAGNOSIS — F4011 Social phobia, generalized: Secondary | ICD-10-CM | POA: Diagnosis not present

## 2022-03-10 DIAGNOSIS — F3181 Bipolar II disorder: Secondary | ICD-10-CM | POA: Diagnosis not present

## 2022-03-10 DIAGNOSIS — F410 Panic disorder [episodic paroxysmal anxiety] without agoraphobia: Secondary | ICD-10-CM | POA: Diagnosis not present

## 2022-04-21 DIAGNOSIS — J019 Acute sinusitis, unspecified: Secondary | ICD-10-CM | POA: Diagnosis not present

## 2022-04-21 DIAGNOSIS — H6980 Other specified disorders of Eustachian tube, unspecified ear: Secondary | ICD-10-CM | POA: Diagnosis not present

## 2022-05-05 DIAGNOSIS — G4700 Insomnia, unspecified: Secondary | ICD-10-CM | POA: Diagnosis not present

## 2022-05-05 DIAGNOSIS — F410 Panic disorder [episodic paroxysmal anxiety] without agoraphobia: Secondary | ICD-10-CM | POA: Diagnosis not present

## 2022-05-05 DIAGNOSIS — F4011 Social phobia, generalized: Secondary | ICD-10-CM | POA: Diagnosis not present

## 2022-05-05 DIAGNOSIS — F3181 Bipolar II disorder: Secondary | ICD-10-CM | POA: Diagnosis not present

## 2022-05-14 ENCOUNTER — Other Ambulatory Visit: Payer: Self-pay

## 2022-05-14 ENCOUNTER — Emergency Department (HOSPITAL_COMMUNITY): Payer: Medicare Other

## 2022-05-14 ENCOUNTER — Emergency Department (HOSPITAL_COMMUNITY)
Admission: EM | Admit: 2022-05-14 | Discharge: 2022-05-14 | Disposition: A | Discharge status: Home / Self Care | Payer: Medicare Other | Attending: Emergency Medicine | Admitting: Emergency Medicine

## 2022-05-14 ENCOUNTER — Encounter (HOSPITAL_COMMUNITY): Payer: Self-pay

## 2022-05-14 DIAGNOSIS — N132 Hydronephrosis with renal and ureteral calculous obstruction: Secondary | ICD-10-CM | POA: Diagnosis not present

## 2022-05-14 DIAGNOSIS — N1330 Unspecified hydronephrosis: Secondary | ICD-10-CM | POA: Diagnosis not present

## 2022-05-14 DIAGNOSIS — R109 Unspecified abdominal pain: Secondary | ICD-10-CM | POA: Diagnosis not present

## 2022-05-14 DIAGNOSIS — N200 Calculus of kidney: Secondary | ICD-10-CM | POA: Diagnosis not present

## 2022-05-14 LAB — COMPREHENSIVE METABOLIC PANEL
ALT: 24 U/L (ref 0–44)
AST: 18 U/L (ref 15–41)
Albumin: 3.9 g/dL (ref 3.5–5.0)
Alkaline Phosphatase: 61 U/L (ref 38–126)
Anion gap: 8 (ref 5–15)
BUN: 20 mg/dL (ref 6–20)
CO2: 23 mmol/L (ref 22–32)
Calcium: 9 mg/dL (ref 8.9–10.3)
Chloride: 107 mmol/L (ref 98–111)
Creatinine, Ser: 0.8 mg/dL (ref 0.44–1.00)
GFR, Estimated: >60 mL/min (ref >60)
Glucose, Bld: 130 mg/dL — ABNORMAL HIGH (ref 70–99)
Potassium: 3.6 mmol/L (ref 3.5–5.1)
Sodium: 138 mmol/L (ref 135–145)
Total Bilirubin: 0.3 mg/dL (ref 0.3–1.2)
Total Protein: 7.6 g/dL (ref 6.5–8.1)

## 2022-05-14 LAB — URINALYSIS, ROUTINE W REFLEX MICROSCOPIC
Bacteria, UA: NONE SEEN
Bilirubin Urine: NEGATIVE
Glucose, UA: NEGATIVE mg/dL
Ketones, ur: NEGATIVE mg/dL
Leukocytes,Ua: NEGATIVE
Nitrite: NEGATIVE
Protein, ur: NEGATIVE mg/dL
RBC / HPF: >50 RBC/hpf — ABNORMAL HIGH (ref 0–5)
Specific Gravity, Urine: 1.021 (ref 1.005–1.030)
pH: 5 (ref 5.0–8.0)

## 2022-05-14 LAB — CBC WITH DIFFERENTIAL/PLATELET
Abs Immature Granulocytes: 0.02 10*3/uL (ref 0.00–0.07)
Basophils Absolute: 0.1 10*3/uL (ref 0.0–0.1)
Basophils Relative: 0 %
Eosinophils Absolute: 0.1 10*3/uL (ref 0.0–0.5)
Eosinophils Relative: 1 %
HCT: 38.8 % (ref 36.0–46.0)
Hemoglobin: 12.9 g/dL (ref 12.0–15.0)
Immature Granulocytes: 0 %
Lymphocytes Relative: 13 %
Lymphs Abs: 1.4 10*3/uL (ref 0.7–4.0)
MCH: 28.6 pg (ref 26.0–34.0)
MCHC: 33.2 g/dL (ref 30.0–36.0)
MCV: 86 fL (ref 80.0–100.0)
Monocytes Absolute: 0.3 10*3/uL (ref 0.1–1.0)
Monocytes Relative: 3 %
Neutro Abs: 9.3 10*3/uL — ABNORMAL HIGH (ref 1.7–7.7)
Neutrophils Relative %: 83 %
Platelets: 359 10*3/uL (ref 150–400)
RBC: 4.51 MIL/uL (ref 3.87–5.11)
RDW: 13.3 % (ref 11.5–15.5)
WBC: 11.2 10*3/uL — ABNORMAL HIGH (ref 4.0–10.5)
nRBC: 0 % (ref 0.0–0.2)

## 2022-05-14 LAB — LIPASE, BLOOD: Lipase: 42 U/L (ref 11–51)

## 2022-05-14 MED ORDER — ONDANSETRON HCL 4 MG/2ML IJ SOLN
4.0000 mg | Freq: Once | INTRAMUSCULAR | Status: AC
  Administered 2022-05-14: 4 mg via INTRAVENOUS
  Filled 2022-05-14: qty 2

## 2022-05-14 MED ORDER — MORPHINE SULFATE (PF) 4 MG/ML IV SOLN
4.0000 mg | Freq: Once | INTRAVENOUS | Status: AC
  Administered 2022-05-14: 4 mg via INTRAVENOUS
  Filled 2022-05-14: qty 1

## 2022-05-14 MED ORDER — TAMSULOSIN HCL 0.4 MG PO CAPS: 0.4000 mg | ORAL_CAPSULE | Freq: Every day | ORAL | 0 refills | Status: AC

## 2022-05-14 MED ORDER — ONDANSETRON HCL 4 MG PO TABS: 4.0000 mg | ORAL_TABLET | Freq: Three times a day (TID) | ORAL | 0 refills | Status: DC | PRN

## 2022-05-14 MED ORDER — HYDROCODONE-ACETAMINOPHEN 5-325 MG PO TABS: 1.0000 | ORAL_TABLET | Freq: Four times a day (QID) | ORAL | 0 refills | Status: DC | PRN

## 2022-05-14 MED ORDER — FENTANYL CITRATE PF 50 MCG/ML IJ SOSY
50.0000 ug | PREFILLED_SYRINGE | Freq: Once | INTRAMUSCULAR | Status: AC
  Administered 2022-05-14: 50 ug via INTRAVENOUS
  Filled 2022-05-14: qty 1

## 2022-05-14 MED ORDER — IOHEXOL 300 MG/ML  SOLN
100.0000 mL | Freq: Once | INTRAMUSCULAR | Status: AC | PRN
  Administered 2022-05-14: 100 mL via INTRAVENOUS

## 2022-05-14 NOTE — ED Provider Triage Note (Signed)
Emergency Medicine Provider Triage Evaluation Note  Janet Tucker , a 36 y.o. adult  was evaluated in triage.  Pt complains of right-sided flank and abdominal pain.  Patient states that pain began at 4 AM this morning.  Patient also endorses significant nausea and vomiting.  Patient denies chest pain, shortness of breath at this time.  Patient does endorse diarrhea but states that she has a history of GI issues.    Review of Systems  Positive: Right-sided flank pain, right-sided abdominal pain, nausea, vomiting Negative: Chest pain, shortness of breath  Physical Exam  BP 134/69 (BP Location: Right Arm)   Pulse 76   Temp 98.1 F (36.7 C) (Oral)   Resp 20   Ht 5\' 7"  (1.702 m)   Wt 97.5 kg   SpO2 99%   BMI 33.67 kg/m  Gen:   Awake, no distress   Resp:  Normal effort  MSK:   Moves extremities without difficulty  Other:    Medical Decision Making  Medically screening exam initiated at 8:50 PM.  Appropriate orders placed.  Dierra was informed that the remainder of the evaluation will be completed by another provider, this initial triage assessment does not replace that evaluation, and the importance of remaining in the ED until their evaluation is complete.     [place], PA-C 05/14/22 2052

## 2022-05-14 NOTE — Discharge Instructions (Addendum)
You were diagnosed today with a kidney stone.  Please take the prescribed pain medication, nausea medication, and Flomax.  If you become unable to tolerate the pain or if you become unable to tolerate oral fluids, please return to the hospital for reevaluation.  Hopefully you will be able to pass the stone on your home without intervention.  I have provided follow-up information for urology to be contacted if needed

## 2022-05-14 NOTE — ED Provider Notes (Signed)
[facility]-EMERGENCY DEPT Provider Note   CSN: [id number] Arrival date & time: 05/14/22  2021     History  Chief Complaint  Patient presents with   Flank Pain         Janet Tucker is a 36 y.o. adult.  Patient presents to the hospital complaining of right-sided flank and abdominal pain.  Patient also endorses nausea and vomiting.  Patient states that symptoms started fairly suddenly at approximately 4 AM this morning.  Patient states that the pain has fluctuated in intensity throughout the day.  Describes colicky pain.  Patient denies shortness of breath, chest pain, dysuria at this time.  Also denies hematuria.  Past medical history significant for female to female transgender person, depression, eczema, arthritis, chronic back pain, bipolar disorder, sciatic nerve injury, psychogenic syncope, anxiety, PTSD, cholecystectomy  HPI     Home Medications Prior to Admission medications   Medication Sig Start Date End Date Taking? Authorizing Provider  HYDROcodone-acetaminophen (NORCO/VICODIN) 5-325 MG tablet Take 1-2 tablets by mouth every 6 (six) hours as needed. 05/14/22  Yes Barrie Dunker B, PA-C  ondansetron (ZOFRAN) 4 MG tablet Take 1 tablet (4 mg total) by mouth every 8 (eight) hours as needed for nausea or vomiting. 05/14/22  Yes Darrick Grinder, PA-C  tamsulosin (FLOMAX) 0.4 MG CAPS capsule Take 1 capsule (0.4 mg total) by mouth daily for 15 days. 05/14/22 05/29/22 Yes Darrick Grinder, PA-C  albuterol (VENTOLIN HFA) 108 (90 Base) MCG/ACT inhaler Inhale into the lungs every 6 (six) hours as needed for wheezing or shortness of breath. Patient not taking: Reported on 05/04/2020    [provider]  ALPRAZolam Prudy Feeler) 1 MG tablet Take 1 mg by mouth at bedtime as needed for anxiety.    [provider]  budesonide-formoterol (SYMBICORT) 160-4.5 MCG/ACT inhaler Inhale 2 puffs into the lungs 2 (two) times daily. Patient not taking: Reported on 05/04/2020     [provider]  buPROPion (WELLBUTRIN XL) 300 MG 24 hr tablet Take 300 mg by mouth daily.    [provider]  estradiol (ESTRACE) 2 MG tablet Take 1/2 tab po qd and 1 tab po qhs 08/13/18   Sherren Mocha, MD  gabapentin (NEURONTIN) 300 MG capsule Take 300 mg by mouth at bedtime.    [provider]  ibuprofen (ADVIL) 800 MG tablet Take 1 tablet (800 mg total) by mouth every 8 (eight) hours. 10/22/20   Kinsinger, De Blanch, MD  lamoTRIgine (LAMICTAL) 150 MG tablet Take 150 mg by mouth daily.    [provider]  oxyCODONE (ROXICODONE) 5 MG immediate release tablet Take 1 tablet (5 mg total) by mouth every 6 (six) hours as needed for severe pain. 10/22/20   Kinsinger, De Blanch, MD  phentermine 37.5 MG capsule Take 37.5 mg by mouth every morning.    [provider]  zolpidem (AMBIEN) 5 MG tablet Take 5 mg by mouth at bedtime.    [provider]      Allergies    Patient has no known allergies.    Review of Systems   Review of Systems  Respiratory:  Negative for shortness of breath.   Cardiovascular:  Negative for chest pain.  Gastrointestinal:  Positive for abdominal pain, diarrhea, nausea and vomiting.  Genitourinary:  Positive for flank pain. Negative for dysuria and hematuria.    Physical Exam Updated Vital Signs BP 136/86   Pulse 63   Temp 98.1 F (36.7 C) (Oral)   Resp  18   Ht 5\' 7"  (1.702 m)   Wt 97.5 kg   SpO2 96%   BMI 33.67 kg/m  Physical Exam Vitals and nursing note reviewed.  Constitutional:      General: She is not in acute distress. HENT:     Head: Normocephalic and atraumatic.     Mouth/Throat:     Mouth: Mucous membranes are moist.  Eyes:     Conjunctiva/sclera: Conjunctivae normal.  Cardiovascular:     Rate and Rhythm: Normal rate and regular rhythm.     Pulses: Normal pulses.     Heart sounds: Normal heart sounds.  Pulmonary:     Effort: Pulmonary effort is normal.     Breath sounds: Normal breath  sounds.  Abdominal:     Palpations: Abdomen is soft.     Tenderness: There is abdominal tenderness (Right lower quadrant tenderness). There is right CVA tenderness.  Musculoskeletal:        General: Normal range of motion.     Cervical back: Normal range of motion and neck supple.  Skin:    General: Skin is warm and dry.     Capillary Refill: Capillary refill takes less than 2 seconds.  Neurological:     Mental Status: She is alert and oriented to person, place, and time.     ED Results / Procedures / Treatments   Labs (all labs ordered are listed, but only abnormal results are displayed) Labs Reviewed  CBC WITH DIFFERENTIAL/PLATELET - Abnormal; Notable for the following components:      Result Value   WBC 11.2 (*)    Neutro Abs 9.3 (*)    All other components within normal limits  COMPREHENSIVE METABOLIC PANEL - Abnormal; Notable for the following components:   Glucose, Bld 130 (*)    All other components within normal limits  URINALYSIS, ROUTINE W REFLEX MICROSCOPIC - Abnormal; Notable for the following components:   Hgb urine dipstick LARGE (*)    RBC / HPF >50 (*)    All other components within normal limits  LIPASE, BLOOD    EKG None  Radiology CT Abdomen Pelvis W Contrast  Result Date: 05/14/2022 CLINICAL DATA:  Flank pain.  Concern for kidney stone. EXAM: CT ABDOMEN AND PELVIS WITH CONTRAST TECHNIQUE: Multidetector CT imaging of the abdomen and pelvis was performed using the standard protocol following bolus administration of intravenous contrast. RADIATION DOSE REDUCTION: This exam was performed according to the departmental dose-optimization program which includes automated exposure control, adjustment of the mA and/or kV according to patient size and/or use of iterative reconstruction technique. CONTRAST:  07/14/2022 OMNIPAQUE IOHEXOL 300 MG/ML  SOLN COMPARISON:  CT abdomen pelvis dated 10/22/2020. Abdominal ultrasound dated 10/22/2020. FINDINGS: Lower chest: The  visualized lung bases are clear. No intra-abdominal free air or free fluid. Hepatobiliary: Probable mild fatty liver. No biliary ductal dilatation. Cholecystectomy. No retained calcified stone noted in the central CBD. Pancreas: Unremarkable. No pancreatic ductal dilatation or surrounding inflammatory changes. Spleen: Normal in size without focal abnormality. Adrenals/Urinary Tract: The adrenal glands unremarkable. 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 There is a 5 mm distal right ureteral calculus with mild right hydronephrosis. There is no hydronephrosis on the left. The left ureter and urinary bladder appear unremarkable. Stomach/Bowel: There is no bowel obstruction or active inflammation. The appendix is normal. Vascular/Lymphatic: The abdominal aorta and IVC unremarkable. No portal venous gas. There is no adenopathy. Reproductive: Hysterectomy.  No adnexal masses. Other: None Musculoskeletal:  No acute or significant osseous findings. IMPRESSION: 1. A 5 mm distal right ureteral calculus with mild right hydronephrosis. 2. Horseshoe renal morphology. Electronically Signed   By: Elgie Collard M.D.   On: 05/14/2022 22:28    Procedures Procedures    Medications Ordered in ED Medications  fentaNYL (SUBLIMAZE) injection 50 mcg (50 mcg Intravenous Given 05/14/22 2054)  ondansetron (ZOFRAN) injection 4 mg (4 mg Intravenous Given 05/14/22 2054)  iohexol (OMNIPAQUE) 300 MG/ML solution 100 mL (100 mLs Intravenous Contrast Given 05/14/22 2205)  morphine (PF) 4 MG/ML injection 4 mg (4 mg Intravenous Given 05/14/22 2250)    ED Course/ Medical Decision Making/ A&P                           Medical Decision Making Amount and/or Complexity of Data Reviewed Labs: ordered. Radiology: ordered.  Risk Prescription drug management.   This patient presents to the ED for concern of right flank pain, nausea, vomiting, this involves an extensive number of treatment options,  and is a complaint that carries with it a high risk of complications and morbidity.  The differential diagnosis includes nephrolithiasis, pyelonephritis, appendicitis, gastroenteritis, others   Co morbidities that complicate the patient evaluation  History of gastrointestinal issues   Additional history obtained:  Additional history obtained from visitor bedside External records from outside source obtained and reviewed including notes showing previous laparoscopic cholecystectomy   Lab Tests:  I Ordered, and personally interpreted labs.  The pertinent results include: WBC 11.2, lipase 42, grossly normal CMP, urinalysis with large hemoglobin dipstick and greater than 50 RBC   Imaging Studies ordered:  I ordered imaging studies including CT abdomen pelvis with contrast I independently visualized and interpreted imaging which showed  1. A 5 mm distal right ureteral calculus with mild right  hydronephrosis.  2. Horseshoe renal morphology.   I agree with the radiologist interpretation   Problem List / ED Course / Critical interventions / Medication management   I ordered medication including fentanyl and morphine for pain, Zofran for nausea Reevaluation of the patient after these medicines showed that the patient improved I have reviewed the patients home medicines and have made adjustments as needed   Test / Admission - Considered:  The patient has a 5 mm stone noted on imaging.  Mild hydronephrosis noted.  The size of the stone should be amenable to passage at home without surgical intervention.  The patient is to be discharged with Flomax, pain medication, and Zofran.  Return precautions provided.        Final Clinical Impression(s) / ED Diagnoses Final diagnoses:  Nephrolithiasis    Rx / DC Orders ED Discharge Orders          Ordered    HYDROcodone-acetaminophen (NORCO/VICODIN) 5-325 MG tablet  Every 6 hours PRN        05/14/22 2253    ondansetron (ZOFRAN) 4  MG tablet  Every 8 hours PRN        05/14/22 2253    tamsulosin (FLOMAX) 0.4 MG CAPS capsule  Daily        05/14/22 2253              Pamala Duffel 05/14/22 2254    Mardene Sayer, MD 05/14/22 2336

## 2022-05-14 NOTE — ED Triage Notes (Addendum)
0400 flank pain on R side radiating to groin. Nausea and vomiting. Very difficult to get comfortable. Has horseshoe shaped kidney per hx. Pt is transgender but has had surgery.

## 2022-06-03 ENCOUNTER — Emergency Department (HOSPITAL_COMMUNITY): Payer: Medicare Other

## 2022-06-03 ENCOUNTER — Other Ambulatory Visit: Payer: Self-pay

## 2022-06-03 ENCOUNTER — Encounter (HOSPITAL_COMMUNITY): Payer: Self-pay

## 2022-06-03 ENCOUNTER — Emergency Department (HOSPITAL_COMMUNITY)
Admission: EM | Admit: 2022-06-03 | Discharge: 2022-06-03 | Disposition: A | Discharge status: Home / Self Care | Payer: Medicare Other | Attending: Emergency Medicine | Admitting: Emergency Medicine

## 2022-06-03 DIAGNOSIS — N23 Unspecified renal colic: Secondary | ICD-10-CM | POA: Diagnosis not present

## 2022-06-03 DIAGNOSIS — N132 Hydronephrosis with renal and ureteral calculous obstruction: Secondary | ICD-10-CM | POA: Insufficient documentation

## 2022-06-03 DIAGNOSIS — N1330 Unspecified hydronephrosis: Secondary | ICD-10-CM | POA: Diagnosis not present

## 2022-06-03 DIAGNOSIS — N134 Hydroureter: Secondary | ICD-10-CM | POA: Diagnosis not present

## 2022-06-03 DIAGNOSIS — R109 Unspecified abdominal pain: Secondary | ICD-10-CM | POA: Diagnosis not present

## 2022-06-03 LAB — URINALYSIS, ROUTINE W REFLEX MICROSCOPIC
Bilirubin Urine: NEGATIVE
Glucose, UA: NEGATIVE mg/dL
Hgb urine dipstick: NEGATIVE
Ketones, ur: NEGATIVE mg/dL
Leukocytes,Ua: NEGATIVE
Nitrite: NEGATIVE
Protein, ur: NEGATIVE mg/dL
Specific Gravity, Urine: 1.03 (ref 1.005–1.030)
pH: 5 (ref 5.0–8.0)

## 2022-06-03 LAB — CBC WITH DIFFERENTIAL/PLATELET
Abs Immature Granulocytes: 0.04 10*3/uL (ref 0.00–0.07)
Basophils Absolute: 0.1 10*3/uL (ref 0.0–0.1)
Basophils Relative: 1 %
Eosinophils Absolute: 0.3 10*3/uL (ref 0.0–0.5)
Eosinophils Relative: 2 %
HCT: 38.5 % (ref 36.0–46.0)
Hemoglobin: 12.7 g/dL (ref 12.0–15.0)
Immature Granulocytes: 0 %
Lymphocytes Relative: 23 %
Lymphs Abs: 2.5 10*3/uL (ref 0.7–4.0)
MCH: 28.7 pg (ref 26.0–34.0)
MCHC: 33 g/dL (ref 30.0–36.0)
MCV: 86.9 fL (ref 80.0–100.0)
Monocytes Absolute: 0.7 10*3/uL (ref 0.1–1.0)
Monocytes Relative: 6 %
Neutro Abs: 7.4 10*3/uL (ref 1.7–7.7)
Neutrophils Relative %: 68 %
Platelets: 408 10*3/uL — ABNORMAL HIGH (ref 150–400)
RBC: 4.43 MIL/uL (ref 3.87–5.11)
RDW: 13.4 % (ref 11.5–15.5)
WBC: 10.9 10*3/uL — ABNORMAL HIGH (ref 4.0–10.5)
nRBC: 0 % (ref 0.0–0.2)

## 2022-06-03 LAB — COMPREHENSIVE METABOLIC PANEL
ALT: 19 U/L (ref 0–44)
AST: 22 U/L (ref 15–41)
Albumin: 3.6 g/dL (ref 3.5–5.0)
Alkaline Phosphatase: 64 U/L (ref 38–126)
Anion gap: 6 (ref 5–15)
BUN: 23 mg/dL — ABNORMAL HIGH (ref 6–20)
CO2: 22 mmol/L (ref 22–32)
Calcium: 8.7 mg/dL — ABNORMAL LOW (ref 8.9–10.3)
Chloride: 108 mmol/L (ref 98–111)
Creatinine, Ser: 1.13 mg/dL — ABNORMAL HIGH (ref 0.44–1.00)
GFR, Estimated: >60 mL/min (ref >60)
Glucose, Bld: 110 mg/dL — ABNORMAL HIGH (ref 70–99)
Potassium: 3.8 mmol/L (ref 3.5–5.1)
Sodium: 136 mmol/L (ref 135–145)
Total Bilirubin: 0.4 mg/dL (ref 0.3–1.2)
Total Protein: 7 g/dL (ref 6.5–8.1)

## 2022-06-03 LAB — LIPASE, BLOOD: Lipase: 41 U/L (ref 11–51)

## 2022-06-03 MED ORDER — HYDROMORPHONE HCL 1 MG/ML IJ SOLN
1.0000 mg | Freq: Once | INTRAMUSCULAR | Status: AC
  Administered 2022-06-03: 1 mg via INTRAVENOUS
  Filled 2022-06-03: qty 1

## 2022-06-03 MED ORDER — ONDANSETRON 4 MG PO TBDP: 4.0000 mg | ORAL_TABLET | Freq: Three times a day (TID) | ORAL | 0 refills | Status: AC | PRN

## 2022-06-03 MED ORDER — IOHEXOL 300 MG/ML  SOLN
100.0000 mL | Freq: Once | INTRAMUSCULAR | Status: AC | PRN
  Administered 2022-06-03: 100 mL via INTRAVENOUS

## 2022-06-03 MED ORDER — SODIUM CHLORIDE 0.9 % IV BOLUS
1000.0000 mL | Freq: Once | INTRAVENOUS | Status: AC
  Administered 2022-06-03: 1000 mL via INTRAVENOUS

## 2022-06-03 MED ORDER — SODIUM CHLORIDE (PF) 0.9 % IJ SOLN
INTRAMUSCULAR | Status: AC
  Filled 2022-06-03: qty 50

## 2022-06-03 MED ORDER — ONDANSETRON HCL 4 MG/2ML IJ SOLN
4.0000 mg | Freq: Once | INTRAMUSCULAR | Status: AC
  Administered 2022-06-03: 4 mg via INTRAVENOUS
  Filled 2022-06-03: qty 2

## 2022-06-03 MED ORDER — IBUPROFEN 600 MG PO TABS: 600.0000 mg | ORAL_TABLET | Freq: Three times a day (TID) | ORAL | 0 refills | Status: AC | PRN

## 2022-06-03 MED ORDER — KETOROLAC TROMETHAMINE 15 MG/ML IJ SOLN
15.0000 mg | Freq: Once | INTRAMUSCULAR | Status: AC
  Administered 2022-06-03: 15 mg via INTRAVENOUS
  Filled 2022-06-03: qty 1

## 2022-06-03 MED ORDER — HYDROCODONE-ACETAMINOPHEN 5-325 MG PO TABS: 1.0000 | ORAL_TABLET | ORAL | 0 refills | Status: AC | PRN

## 2022-06-03 NOTE — Discharge Instructions (Addendum)
Return to the ER or call 911 if you develop intractable pain, fever, vomiting, urinary tract infection symptoms, or any other new/concerning symptoms.

## 2022-06-03 NOTE — ED Triage Notes (Signed)
Patient c/o right flank pain that radiates into the Right lower abdomen since 2000 last night. Patient states she has a known kidney stone.

## 2022-06-03 NOTE — ED Provider Notes (Signed)
[facility] DEPT Provider Note   CSN: [id number] Arrival date & time: 06/03/22  0710     History  Chief Complaint  Patient presents with   Flank Pain    Janet Tucker is a 36 y.o. adult.  HPI 36 year old presents with right back/abdominal pain. Is concerned about a recurrent kidney stone. Was here on 9/2 with similar but more severe symptoms. Pain went away after a few days, though the patient never actually saw a kidney stone pass. Since last night has had recurrent pain. A couple episodes of mild vomiting. No fevers. Feels a urinary urgency without dysuria. Took hydrocodone and ibuprofen last night but didn't help. Pain is about 7/10.  Home Medications Prior to Admission medications   Medication Sig Start Date End Date Taking? Authorizing Provider  HYDROcodone-acetaminophen (NORCO) 5-325 MG tablet Take 1-2 tablets by mouth every 4 (four) hours as needed for severe pain. 06/03/22  Yes Sherwood Gambler, MD  ibuprofen (ADVIL) 600 MG tablet Take 1 tablet (600 mg total) by mouth every 8 (eight) hours as needed. 06/03/22  Yes Sherwood Gambler, MD  ondansetron (ZOFRAN-ODT) 4 MG disintegrating tablet Take 1 tablet (4 mg total) by mouth every 8 (eight) hours as needed for nausea or vomiting. 06/03/22  Yes Sherwood Gambler, MD  albuterol (VENTOLIN HFA) 108 (90 Base) MCG/ACT inhaler Inhale into the lungs every 6 (six) hours as needed for wheezing or shortness of breath. Patient not taking: Reported on 05/04/2020    [provider]  ALPRAZolam Duanne Moron) 1 MG tablet Take 1 mg by mouth at bedtime as needed for anxiety.    [provider]  budesonide-formoterol (SYMBICORT) 160-4.5 MCG/ACT inhaler Inhale 2 puffs into the lungs 2 (two) times daily. Patient not taking: Reported on 05/04/2020    [provider]  buPROPion (WELLBUTRIN XL) 300 MG 24 hr tablet Take 300 mg by mouth daily.    [provider]  estradiol (ESTRACE) 2 MG tablet Take 1/2  tab po qd and 1 tab po qhs 08/13/18   Shawnee Knapp, MD  gabapentin (NEURONTIN) 300 MG capsule Take 300 mg by mouth at bedtime.    [provider]  lamoTRIgine (LAMICTAL) 150 MG tablet Take 150 mg by mouth daily.    [provider]  phentermine 37.5 MG capsule Take 37.5 mg by mouth every morning.    [provider]  zolpidem (AMBIEN) 5 MG tablet Take 5 mg by mouth at bedtime.    [provider]      Allergies    Patient has no known allergies.    Review of Systems   Review of Systems  Constitutional:  Negative for fever.  Gastrointestinal:  Positive for abdominal pain, nausea and vomiting.  Genitourinary:  Positive for urgency. Negative for dysuria.  Musculoskeletal:  Positive for back pain.    Physical Exam Updated Vital Signs BP (!) 147/90   Pulse 82   Temp 97.8 F (36.6 C) (Oral)   Resp 18   Ht 5\' 7"  (1.702 m)   Wt 97.5 kg   SpO2 97%   BMI 33.67 kg/m  Physical Exam Vitals and nursing note reviewed.  Constitutional:      Comments: Sitting up, no acute distress. No diaphoresis.  HENT:     Head: Normocephalic and atraumatic.  Cardiovascular:     Rate and Rhythm: Normal rate and regular rhythm.     Heart sounds: Normal heart sounds.  Pulmonary:     Effort: Pulmonary effort  is normal.     Breath sounds: Normal breath sounds.  Abdominal:     Palpations: Abdomen is soft.     Tenderness: There is abdominal tenderness in the right lower quadrant. There is right CVA tenderness.  Skin:    General: Skin is warm and dry.  Neurological:     Mental Status: She is alert.     ED Results / Procedures / Treatments   Labs (all labs ordered are listed, but only abnormal results are displayed) Labs Reviewed  COMPREHENSIVE METABOLIC PANEL - Abnormal; Notable for the following components:      Result Value   Glucose, Bld 110 (*)    BUN 23 (*)    Creatinine, Ser 1.13 (*)    Calcium 8.7 (*)    All other components within normal limits  CBC WITH  DIFFERENTIAL/PLATELET - Abnormal; Notable for the following components:   WBC 10.9 (*)    Platelets 408 (*)    All other components within normal limits  URINALYSIS, ROUTINE W REFLEX MICROSCOPIC - Abnormal; Notable for the following components:   APPearance HAZY (*)    All other components within normal limits  LIPASE, BLOOD    EKG None  Radiology CT ABDOMEN PELVIS W CONTRAST  Result Date: 06/03/2022 CLINICAL DATA:  Right lower quadrant flank pain, history of nephrolithiasis and horseshoe kidney EXAM: CT ABDOMEN AND PELVIS WITH CONTRAST TECHNIQUE: Multidetector CT imaging of the abdomen and pelvis was performed using the standard protocol following bolus administration of intravenous contrast. RADIATION DOSE REDUCTION: This exam was performed according to the departmental dose-optimization program which includes automated exposure control, adjustment of the mA and/or kV according to patient size and/or use of iterative reconstruction technique. CONTRAST:  164mL OMNIPAQUE IOHEXOL 300 MG/ML  SOLN COMPARISON:  05/14/2022 FINDINGS: Lower chest: Trace dependent basilar atelectasis. No pericardial or pleural effusion. Normal heart size. Hepatobiliary: Slight decreased attenuation of the liver compatible with mild hepatic steatosis. No focal hepatic abnormality. Remote cholecystectomy. Stable prominence of the common bile duct. Pancreas: Unremarkable. No pancreatic ductal dilatation or surrounding inflammatory changes. Spleen: Normal in size without focal abnormality. Adrenals/Urinary Tract: Normal adrenal glands. Horseshoe kidney configuration again noted. Previously described obstructing 5 mm right distal ureteral calculus has migrated slightly closer to the right UVJ, image 79/2. This stone still results in moderate right hydroureteronephrosis. No obstruction or acute finding on the left side. Urinary bladder collapsed. Stomach/Bowel: Stomach is within normal limits. Appendix appears normal. No evidence  of bowel wall thickening, distention, or inflammatory changes. Vascular/Lymphatic: No significant vascular findings are present. No enlarged abdominal or pelvic lymph nodes. Reproductive: Status post hysterectomy. No adnexal masses. Other: No abdominal wall hernia or abnormality. No abdominopelvic ascites. Musculoskeletal: No acute or significant osseous findings. IMPRESSION: Known 5 mm right distal ureteral calculus remains but has slightly migrated closer to the right UVJ. Persistent moderate right hydroureteronephrosis. Horseshoe kidney. Remote cholecystectomy and hysterectomy Electronically Signed   By: Jerilynn Mages.  Shick M.D.   On: 06/03/2022 09:58   US RENAL  Result Date: 06/03/2022 CLINICAL DATA:  RIGHT lower quadrant pain EXAM: RENAL / URINARY TRACT ULTRASOUND COMPLETE COMPARISON:  CT abdomen dated 05/14/2022 FINDINGS: Right Kidney: Renal measurements: 12.3 x 5.1 x 4.4 cm = volume: 146 mL. Echogenicity within normal limits. Mild hydronephrosis. Left Kidney: Renal measurements: 9.8 x 3.7 x 3.8 cm = volume: 73 mL. Echogenicity within normal limits. No mass or hydronephrosis visualized. Bladder: Bladder is decompressed, limiting characterization. Other: None. IMPRESSION: 1. Persistent mild RIGHT-sided hydronephrosis. This may  indicate that the obstructive 5 mm distal ureteral stone identified on CT abdomen/pelvis of 05/14/2022 is still present within the ureter. 2. Normal LEFT kidney. 3. The horseshoe kidneys (parenchymal fusion of both kidneys) was better demonstrated on the earlier CT. Electronically Signed   By: Franki Cabot M.D.   On: 06/03/2022 08:28    Procedures Procedures    Medications Ordered in ED Medications  sodium chloride 0.9 % bolus 1,000 mL (0 mLs Intravenous Stopped 06/03/22 0856)  ketorolac (TORADOL) 15 MG/ML injection 15 mg (15 mg Intravenous Given 06/03/22 0750)  ondansetron (ZOFRAN) injection 4 mg (4 mg Intravenous Given 06/03/22 0749)  HYDROmorphone (DILAUDID) injection 1 mg (1 mg  Intravenous Given 06/03/22 0854)  iohexol (OMNIPAQUE) 300 MG/ML solution 100 mL (100 mLs Intravenous Contrast Given 06/03/22 0933)  sodium chloride (PF) 0.9 % injection (  Given by Other 06/03/22 6213)    ED Course/ Medical Decision Making/ A&P                           Medical Decision Making Amount and/or Complexity of Data Reviewed Labs: ordered.    Details: Slight leukocytosis at 10.9.  Mild bump in creatinine at 1.13.  UA without UTI Radiology: ordered and independent interpretation performed.    Details: Mild right hydronephrosis on ultrasound.  Right ureteral stone on CT  Risk Prescription drug management.   Patient seems to have recurred are not right ureteral colic.  She is fairly tender which combined with seeming to have stopped having symptoms and then recurring may be consider another CT to rule out appendicitis.  The CT seems to show a slightly moved but still present right ureteral stone near the UVJ.  Pain is better controlled after IV Dilaudid.  Vital signs are otherwise unremarkable.  At this point, I think patient is stable for outpatient pain control and follow-up with urology.  We discussed return precautions but otherwise appears stable for discharge home.        Final Clinical Impression(s) / ED Diagnoses Final diagnoses:  Ureteral colic    Rx / DC Orders ED Discharge Orders          Ordered    HYDROcodone-acetaminophen (NORCO) 5-325 MG tablet  Every 4 hours PRN        06/03/22 1010    ibuprofen (ADVIL) 600 MG tablet  Every 8 hours PRN        06/03/22 1010    ondansetron (ZOFRAN-ODT) 4 MG disintegrating tablet  Every 8 hours PRN        06/03/22 1010              Sherwood Gambler, MD 06/03/22 1027

## 2022-06-06 DIAGNOSIS — Q631 Lobulated, fused and horseshoe kidney: Secondary | ICD-10-CM | POA: Diagnosis not present

## 2022-06-06 DIAGNOSIS — N201 Calculus of ureter: Secondary | ICD-10-CM | POA: Diagnosis not present

## 2022-06-06 DIAGNOSIS — R1084 Generalized abdominal pain: Secondary | ICD-10-CM | POA: Diagnosis not present

## 2022-06-27 DIAGNOSIS — R1084 Generalized abdominal pain: Secondary | ICD-10-CM | POA: Diagnosis not present

## 2022-06-27 DIAGNOSIS — N201 Calculus of ureter: Secondary | ICD-10-CM | POA: Diagnosis not present

## 2022-07-05 ENCOUNTER — Other Ambulatory Visit: Payer: Self-pay | Admitting: Urology

## 2022-07-06 DIAGNOSIS — Q631 Lobulated, fused and horseshoe kidney: Secondary | ICD-10-CM | POA: Diagnosis not present

## 2022-07-06 DIAGNOSIS — Z9049 Acquired absence of other specified parts of digestive tract: Secondary | ICD-10-CM | POA: Diagnosis not present

## 2022-07-06 DIAGNOSIS — N201 Calculus of ureter: Secondary | ICD-10-CM | POA: Diagnosis not present

## 2022-07-06 DIAGNOSIS — Z90710 Acquired absence of both cervix and uterus: Secondary | ICD-10-CM | POA: Diagnosis not present

## 2022-07-07 ENCOUNTER — Encounter (HOSPITAL_BASED_OUTPATIENT_CLINIC_OR_DEPARTMENT_OTHER): Payer: Self-pay | Admitting: Urology

## 2022-07-11 ENCOUNTER — Encounter (HOSPITAL_BASED_OUTPATIENT_CLINIC_OR_DEPARTMENT_OTHER): Payer: Self-pay | Admitting: Urology

## 2022-07-11 NOTE — Progress Notes (Signed)
Spoke w/ via phone for pre-op interview--- pt Lab needs dos----  no             Lab results------ no COVID test -----patient states asymptomatic no test needed Arrive at ------- 1115 on 07-13-2022 NPO after MN NO Solid Food.  Clear liquids from MN until--- 1015 Med rec completed Medications to take morning of surgery ----- wellbutrin, prozac, estradial Diabetic medication ----- n/a Patient instructed no nail polish to be worn day of surgery Patient instructed to bring photo id and insurance card day of surgery Patient aware to have Driver (ride ) / caregiver  for 24 hours after surgery --- husband, patrick Patient Special Instructions ----- n/a Pre-Op special Istructions ----- n/a Patient verbalized understanding of instructions that were given at this phone interview. Patient denies shortness of breath, chest pain, fever, cough at this phone interview.

## 2022-07-12 DIAGNOSIS — R1084 Generalized abdominal pain: Secondary | ICD-10-CM | POA: Diagnosis not present

## 2022-07-12 DIAGNOSIS — N201 Calculus of ureter: Secondary | ICD-10-CM | POA: Diagnosis not present

## 2022-07-13 ENCOUNTER — Ambulatory Visit (HOSPITAL_BASED_OUTPATIENT_CLINIC_OR_DEPARTMENT_OTHER): Payer: Medicare Other | Admitting: Anesthesiology

## 2022-07-13 ENCOUNTER — Other Ambulatory Visit: Payer: Self-pay

## 2022-07-13 ENCOUNTER — Encounter (HOSPITAL_BASED_OUTPATIENT_CLINIC_OR_DEPARTMENT_OTHER): Payer: Self-pay | Admitting: Urology

## 2022-07-13 ENCOUNTER — Encounter (HOSPITAL_BASED_OUTPATIENT_CLINIC_OR_DEPARTMENT_OTHER)
Admission: RE | Disposition: A | Discharge status: Home / Self Care | Payer: Self-pay | Source: Home / Self Care | Attending: Urology

## 2022-07-13 ENCOUNTER — Ambulatory Visit (HOSPITAL_BASED_OUTPATIENT_CLINIC_OR_DEPARTMENT_OTHER)
Admission: RE | Admit: 2022-07-13 | Discharge: 2022-07-13 | Disposition: A | Discharge status: Home / Self Care | Payer: Medicare Other | Attending: Urology | Admitting: Urology

## 2022-07-13 DIAGNOSIS — Q564 Indeterminate sex, unspecified: Secondary | ICD-10-CM | POA: Diagnosis not present

## 2022-07-13 DIAGNOSIS — Z01818 Encounter for other preprocedural examination: Secondary | ICD-10-CM

## 2022-07-13 DIAGNOSIS — F319 Bipolar disorder, unspecified: Secondary | ICD-10-CM | POA: Insufficient documentation

## 2022-07-13 DIAGNOSIS — Z6836 Body mass index (BMI) 36.0-36.9, adult: Secondary | ICD-10-CM | POA: Diagnosis not present

## 2022-07-13 DIAGNOSIS — E669 Obesity, unspecified: Secondary | ICD-10-CM | POA: Diagnosis not present

## 2022-07-13 DIAGNOSIS — N201 Calculus of ureter: Secondary | ICD-10-CM | POA: Insufficient documentation

## 2022-07-13 DIAGNOSIS — Q631 Lobulated, fused and horseshoe kidney: Secondary | ICD-10-CM | POA: Diagnosis not present

## 2022-07-13 HISTORY — DX: Lobulated, fused and horseshoe kidney: Q63.1

## 2022-07-13 HISTORY — PX: CYSTOSCOPY WITH RETROGRADE PYELOGRAM, URETEROSCOPY AND STENT PLACEMENT: SHX5789

## 2022-07-13 HISTORY — DX: Mild intermittent asthma, uncomplicated: J45.20

## 2022-07-13 HISTORY — DX: Calculus of ureter: N20.1

## 2022-07-13 HISTORY — DX: Frequency of micturition: R35.0

## 2022-07-13 HISTORY — PX: HOLMIUM LASER APPLICATION: SHX5852

## 2022-07-13 HISTORY — DX: Presence of spectacles and contact lenses: Z97.3

## 2022-07-13 HISTORY — DX: Bipolar II disorder: F31.81

## 2022-07-13 HISTORY — DX: Generalized anxiety disorder: F41.1

## 2022-07-13 SURGERY — CYSTOURETEROSCOPY, WITH RETROGRADE PYELOGRAM AND STENT INSERTION
Anesthesia: General | Site: Renal | Laterality: Right

## 2022-07-13 MED ORDER — FENTANYL CITRATE (PF) 100 MCG/2ML IJ SOLN
INTRAMUSCULAR | Status: AC
  Filled 2022-07-13: qty 2

## 2022-07-13 MED ORDER — MIDAZOLAM HCL 2 MG/2ML IJ SOLN
INTRAMUSCULAR | Status: AC
  Filled 2022-07-13: qty 2

## 2022-07-13 MED ORDER — EPHEDRINE SULFATE-NACL 50-0.9 MG/10ML-% IV SOSY
PREFILLED_SYRINGE | INTRAVENOUS | Status: DC | PRN
  Administered 2022-07-13: 5 mg via INTRAVENOUS
  Administered 2022-07-13: 10 mg via INTRAVENOUS

## 2022-07-13 MED ORDER — OXYCODONE HCL 5 MG/5ML PO SOLN: 5.0000 mg | Freq: Once | ORAL | Status: DC | PRN

## 2022-07-13 MED ORDER — FENTANYL CITRATE (PF) 100 MCG/2ML IJ SOLN
INTRAMUSCULAR | Status: DC | PRN
  Administered 2022-07-13 (×2): 50 ug via INTRAVENOUS

## 2022-07-13 MED ORDER — KETOROLAC TROMETHAMINE 30 MG/ML IJ SOLN
INTRAMUSCULAR | Status: AC
  Filled 2022-07-13: qty 1

## 2022-07-13 MED ORDER — SODIUM CHLORIDE 0.9 % IR SOLN
Status: DC | PRN
  Administered 2022-07-13: 3000 mL

## 2022-07-13 MED ORDER — PROPOFOL 10 MG/ML IV BOLUS
INTRAVENOUS | Status: DC | PRN
  Administered 2022-07-13: 50 mg via INTRAVENOUS
  Administered 2022-07-13: 200 mg via INTRAVENOUS

## 2022-07-13 MED ORDER — KETOROLAC TROMETHAMINE 15 MG/ML IJ SOLN
INTRAMUSCULAR | Status: DC | PRN
  Administered 2022-07-13: 15 mg via INTRAVENOUS

## 2022-07-13 MED ORDER — LACTATED RINGERS IV SOLN: INTRAVENOUS | Status: DC

## 2022-07-13 MED ORDER — LIDOCAINE 2% (20 MG/ML) 5 ML SYRINGE
INTRAMUSCULAR | Status: DC | PRN
  Administered 2022-07-13: 100 mg via INTRAVENOUS

## 2022-07-13 MED ORDER — CEPHALEXIN 500 MG PO CAPS: 500.0000 mg | ORAL_CAPSULE | Freq: Two times a day (BID) | ORAL | 0 refills | Status: AC

## 2022-07-13 MED ORDER — CEFAZOLIN SODIUM-DEXTROSE 2-4 GM/100ML-% IV SOLN
INTRAVENOUS | Status: AC
  Filled 2022-07-13: qty 100

## 2022-07-13 MED ORDER — DEXAMETHASONE SODIUM PHOSPHATE 10 MG/ML IJ SOLN
INTRAMUSCULAR | Status: DC | PRN
  Administered 2022-07-13: 10 mg via INTRAVENOUS

## 2022-07-13 MED ORDER — EPHEDRINE 5 MG/ML INJ
INTRAVENOUS | Status: AC
  Filled 2022-07-13: qty 5

## 2022-07-13 MED ORDER — KETOROLAC TROMETHAMINE 30 MG/ML IJ SOLN: 30.0000 mg | Freq: Once | INTRAMUSCULAR | Status: DC | PRN

## 2022-07-13 MED ORDER — LIDOCAINE HCL (PF) 2 % IJ SOLN
INTRAMUSCULAR | Status: AC
  Filled 2022-07-13: qty 10

## 2022-07-13 MED ORDER — ONDANSETRON HCL 4 MG/2ML IJ SOLN
INTRAMUSCULAR | Status: DC | PRN
  Administered 2022-07-13: 4 mg via INTRAVENOUS

## 2022-07-13 MED ORDER — ONDANSETRON HCL 4 MG/2ML IJ SOLN
INTRAMUSCULAR | Status: AC
  Filled 2022-07-13: qty 2

## 2022-07-13 MED ORDER — PROPOFOL 10 MG/ML IV BOLUS
INTRAVENOUS | Status: AC
  Filled 2022-07-13: qty 20

## 2022-07-13 MED ORDER — CEFAZOLIN SODIUM-DEXTROSE 2-3 GM-%(50ML) IV SOLR
INTRAVENOUS | Status: DC | PRN
  Administered 2022-07-13: 2 g via INTRAVENOUS

## 2022-07-13 MED ORDER — PHENAZOPYRIDINE HCL 200 MG PO TABS: 200.0000 mg | ORAL_TABLET | Freq: Three times a day (TID) | ORAL | 0 refills | Status: AC | PRN

## 2022-07-13 MED ORDER — FENTANYL CITRATE (PF) 100 MCG/2ML IJ SOLN: 25.0000 ug | INTRAMUSCULAR | Status: DC | PRN

## 2022-07-13 MED ORDER — ONDANSETRON HCL 4 MG/2ML IJ SOLN: 4.0000 mg | Freq: Once | INTRAMUSCULAR | Status: DC | PRN

## 2022-07-13 MED ORDER — 0.9 % SODIUM CHLORIDE (POUR BTL) OPTIME
TOPICAL | Status: DC | PRN
  Administered 2022-07-13: 500 mL

## 2022-07-13 MED ORDER — OXYCODONE HCL 5 MG PO TABS: 5.0000 mg | ORAL_TABLET | Freq: Once | ORAL | Status: DC | PRN

## 2022-07-13 MED ORDER — MIDAZOLAM HCL 5 MG/5ML IJ SOLN
INTRAMUSCULAR | Status: DC | PRN
  Administered 2022-07-13: 2 mg via INTRAVENOUS

## 2022-07-13 MED ORDER — OXYBUTYNIN CHLORIDE 5 MG PO TABS: 5.0000 mg | ORAL_TABLET | Freq: Three times a day (TID) | ORAL | 1 refills | Status: AC | PRN

## 2022-07-13 MED ORDER — DEXAMETHASONE SODIUM PHOSPHATE 10 MG/ML IJ SOLN
INTRAMUSCULAR | Status: AC
  Filled 2022-07-13: qty 1

## 2022-07-13 MED ORDER — IOHEXOL 300 MG/ML  SOLN
INTRAMUSCULAR | Status: DC | PRN
  Administered 2022-07-13: 10 mL

## 2022-07-13 SURGICAL SUPPLY — 28 items
APL SKNCLS STERI-STRIP NONHPOA (GAUZE/BANDAGES/DRESSINGS)
BAG DRAIN URO-CYSTO SKYTR STRL (DRAIN) ×1 IMPLANT
BAG DRN UROCATH (DRAIN) ×1
BASKET STONE 1.7 NGAGE (UROLOGICAL SUPPLIES) IMPLANT
BASKET ZERO TIP NITINOL 2.4FR (BASKET) ×1 IMPLANT
BENZOIN TINCTURE PRP APPL 2/3 (GAUZE/BANDAGES/DRESSINGS) IMPLANT
BSKT STON RTRVL ZERO TP 2.4FR (BASKET) ×1
CATH URETL OPEN 5X70 (CATHETERS) IMPLANT
CLOTH BEACON ORANGE TIMEOUT ST (SAFETY) ×1 IMPLANT
FIBER LASER FLEXIVA 365 (UROLOGICAL SUPPLIES) IMPLANT
GLOVE BIO SURGEON STRL SZ7.5 (GLOVE) ×1 IMPLANT
GOWN STRL REUS W/TWL XL LVL3 (GOWN DISPOSABLE) ×1 IMPLANT
GUIDEWIRE STR DUAL SENSOR (WIRE) IMPLANT
GUIDEWIRE ZIPWRE .038 STRAIGHT (WIRE) ×1 IMPLANT
IV NS IRRIG 3000ML ARTHROMATIC (IV SOLUTION) ×2 IMPLANT
KIT TURNOVER CYSTO (KITS) ×1 IMPLANT
MANIFOLD NEPTUNE II (INSTRUMENTS) ×1 IMPLANT
NS IRRIG 500ML POUR BTL (IV SOLUTION) ×1 IMPLANT
PACK CYSTO (CUSTOM PROCEDURE TRAY) ×1 IMPLANT
STENT URET 6FRX24 CONTOUR (STENTS) IMPLANT
STRIP CLOSURE SKIN 1/2X4 (GAUZE/BANDAGES/DRESSINGS) IMPLANT
SYR 10ML LL (SYRINGE) ×1 IMPLANT
SYR TOOMEY IRRIG 70ML (MISCELLANEOUS) ×1
SYRINGE TOOMEY IRRIG 70ML (MISCELLANEOUS) IMPLANT
TRACTIP FLEXIVA PULS ID 200XHI (Laser) IMPLANT
TRACTIP FLEXIVA PULSE ID 200 (Laser) ×1
TUBE CONNECTING 12X1/4 (SUCTIONS) IMPLANT
TUBING UROLOGY SET (TUBING) ×1 IMPLANT

## 2022-07-13 NOTE — Anesthesia Postprocedure Evaluation (Signed)
Anesthesia Post Note  Patient: Janet Tucker  Procedure(s) Performed: CYSTOSCOPY WITH RETROGRADE PYELOGRAM, URETEROSCOPY AND STENT PLACEMENT (Right: Renal) HOLMIUM LASER APPLICATION (Right: Renal)     Patient location during evaluation: PACU Anesthesia Type: General Level of consciousness: awake and alert Pain management: pain level controlled Vital Signs Assessment: post-procedure vital signs reviewed and stable Respiratory status: spontaneous breathing, nonlabored ventilation, respiratory function stable and patient connected to nasal cannula oxygen Cardiovascular status: blood pressure returned to baseline and stable Postop Assessment: no apparent nausea or vomiting Anesthetic complications: no   No notable events documented.  Last Vitals:  Vitals:   07/13/22 1349 07/13/22 1400  BP: 131/77 132/73  Pulse: 77 77  Resp: 15 17  Temp: 36.6 C   SpO2: 97% 95%    Last Pain:  Vitals:   07/13/22 1349  TempSrc:   PainSc: 0-No pain                 Keerthana Vanrossum S

## 2022-07-13 NOTE — Transfer of Care (Signed)
Immediate Anesthesia Transfer of Care Note  Patient: Janet Tucker  Procedure(s) Performed: CYSTOSCOPY WITH RETROGRADE PYELOGRAM, URETEROSCOPY AND STENT PLACEMENT (Right: Renal) HOLMIUM LASER APPLICATION (Right: Renal)  Patient Location: PACU  Anesthesia Type:General  Level of Consciousness: drowsy, patient cooperative and responds to stimulation  Airway & Oxygen Therapy: Patient Spontanous Breathing  Post-op Assessment: Report given to RN and Post -op Vital signs reviewed and stable  Post vital signs: Reviewed and stable  Last Vitals:  Vitals Value Taken Time  BP 131/77 07/13/22 1349  Temp    Pulse 74 07/13/22 1351  Resp 15 07/13/22 1351  SpO2 97 % 07/13/22 1351  Vitals shown include unvalidated device data.  Last Pain:  Vitals:   07/13/22 1144  TempSrc: Oral         Complications: No notable events documented.

## 2022-07-13 NOTE — H&P (Signed)
Office Visit Report     06/27/2022   --------------------------------------------------------------------------------   Janet Tucker  MRN: [id number]  DOB: 01-21-1986, 36 year old Female  SSN:    PRIMARY CARE:  Sarah L. Weber, PA-C  REFERRING:  Si Raider. Liliane Shi, MD  PROVIDER:  Rhoderick Moody, M.D.  TREATING:  Bartholomew Crews, NP  LOCATION:  [facility] [phone]     --------------------------------------------------------------------------------   CC: I have pain in the flank.  HPI: Janet Tucker is a 36 year-old female established patient who is here for flank pain.  -Horseshoe Kidney  -5 mm right UVJ stone on multiple CTs in Sept  -Creatinine- 1.1  -WBC- 10.9  -Currently, the patient's reports that her pain is well managed with 2 hydrocodone every 4-6 hours. She denies nausea/vomiting, fever/chills, dysuria or hematuria. She has not seen a stone pass yet.   06/27/2022: Janet Tucker is a 36 year old who presents today for follow-up of a distal right ureteral stone. She has not seen a stone pass. She denies fevers, chills, gross hematuria. Her last bout of pain was approximately 3 to 4 days ago. She is not having any changes to her voiding habits. Her renal ultrasound today shows resolved hydronephrosis. However, it does appear that there is still an opacity within the distal right ureter on KUB.   The problem is on the right side. Her pain started about approximately 04/12/2022. The pain is sharp. The pain is intermittent.     ALLERGIES: No Known Allergies    MEDICATIONS: Tamsulosin Hcl 0.4 mg capsule 1 capsule PO Daily  Albuterol Sulfate Hfa  Alprazolam 1 mg tablet  Budesonide-Formoterol Fumarate 160 mcg-4.5 mcg/actuation hfa aerosol with adapter  Bupropion Xl 300 mg tablet, extended release 24 hr  Estradiol 2 mg tablet  Gabapentin 300 mg capsule  Hydrocodone-Acetaminophen 5 mg-325 mg tablet  Ibuprofen  Lamotrigine 150 mg tablet  Ondansetron Hcl 4  mg tablet  Phentermine Hcl 37.5 mg capsule  Zolpidem Tartrate 5 mg tablet     GU PSH: No GU PSH    NON-GU PSH: No Non-GU PSH    GU PMH: Flank Pain - 06/06/2022 Ureteral calculus - 06/06/2022      PMH Notes: Kidney stones  Transgender    NON-GU PMH: Lobulated, 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  Gastrointestinal (Upper):   Patient denies nausea, vomiting, and indigestion/ heartburn.  Gastrointestinal (Lower):   Patient denies diarrhea and constipation.  Constitutional:   Patient denies fever, night sweats, weight loss, and fatigue.  Skin:   Patient denies skin rash/ lesion and itching.  Musculoskeletal:   Patient denies back pain and joint pain.  Neurological:   Patient denies headaches and dizziness.  Psychologic:   Patient denies depression and anxiety.   VITAL SIGNS: None   MULTI-SYSTEM PHYSICAL EXAMINATION:    Constitutional: Well-nourished. No physical deformities. Normally developed. Good grooming.  Respiratory: No labored breathing, no use of accessory muscles.   Cardiovascular: Normal temperature, normal extremity pulses, no swelling, no varicosities.  Skin: No paleness, no jaundice, no cyanosis. No lesion, no ulcer, no rash.  Neurologic /  Psychiatric: Oriented to time, oriented to place, oriented to person. No depression, no anxiety, no agitation.  Gastrointestinal: No mass, no tenderness, no rigidity, non obese abdomen.     Complexity of Data:  Source Of History:  Patient  Records Review:   Previous Doctor Records, Previous Patient Records  Urine Test Review:    Urinalysis  X-Ray Review: KUB: Reviewed Films. Discussed With Patient.  Renal Ultrasound (Limited): Reviewed Films. Reviewed Report.     06/27/22  Urinalysis  Urine Appearance Clear   Urine Color Straw   Urine Glucose Neg mg/dL  Urine Bilirubin Neg mg/dL  Urine Ketones Neg mg/dL  Urine Specific Gravity <=1.005   Urine Blood Neg ery/uL  Urine pH 6.5   Urine Protein Neg mg/dL  Urine Urobilinogen 0.2 mg/dL  Urine Nitrites Neg   Urine Leukocyte Esterase Neg leu/uL   PROCEDURES:         Renal Ultrasound (Limited) - 95621  Kidney:RT Length:10.9 cm Depth4.1: cm Cortical Width: .8 cm Width:4.6 cm    Left Kidney/Ureter:  Normal left kidney. Normal left ureter.  Right Kidney/Ureter:  Normal right kidney. Normal right ureter.  Bladder:  Normal bladder.                KUB - [id number]  A single view of the abdomen is obtained. Bilateral renal shadows are visualized. Within the right distal ureter there is a small opacity consistent with previously seen opacities on prior KUB. This measures approximately 4 mm.      . Patient confirmed No Neulasta OnPro Device.           Urinalysis Dipstick Dipstick Cont'd  Color: Straw Bilirubin: Neg mg/dL  Appearance: Clear Ketones: Neg mg/dL  Specific Gravity: <=3.086 Blood: Neg ery/uL  pH: 6.5 Protein: Neg mg/dL  Glucose: Neg mg/dL Urobilinogen: 0.2 mg/dL    Nitrites: Neg    Leukocyte Esterase: Neg leu/uL         Notes:   . Patient confirmed No Neulasta OnPro Device.     ASSESSMENT:      ICD-10 Details  1 GU:   Flank Pain - R10.84 Right, Acute, Uncomplicated  2   Ureteral calculus - N20.1 Right, Acute, Uncomplicated   PLAN:           Schedule Return Visit/Planned Activity: Next Available Appointment - Schedule Surgery          Document Letter(s):  Created for Patient: Clinical Summary         Notes:   PotentialUrinalysis is clear. Imaging does show 3 to 4 mm opacity within the distal right ureter nonobstructing at this time.  She has not seen a stone pass. She would like to pursue ureteroscopy. I did advise CT imaging prior to undergoing surgical intervention in the form of a CT stone protocol. She was in agreements with this. I will fill out the appropriate paperwork and place it with the appropriate scheduler. She was given strict return precautions.   Stone intervention was discussed in detail today. For ureteroscopy, the patient understands that there is a chance for a staged procedure. Patient also understands that there is risk for bleeding, infection, injury to surrounding organs, and general risks of anesthesia. The patient also understands the placement of a stent and the risks of stent placement including, risk for infection, the risk for pain, and the risk for injury. For ESWL, the patient understands that there is a chance of failure of procedure, there is also a risk for bruising, infection, bleeding,  and injury to surrounding structures. The patient verbalized understanding to these risks.   The risks, benefits and alternatives of cystoscopy with RIGHT ureteroscopy, laser lithotripsy and ureteral stent placement was discussed the patient.  Risks included, but are not limited to: bleeding, urinary tract infection, ureteral injury/avulsion, ureteral stricture formation, retained stone fragments, the possibility that multiple surgeries may be required to treat the stone(s), MI, stroke, PE and the inherent risks of general anesthesia.  The patient voices understanding and wishes to proceed.

## 2022-07-13 NOTE — Anesthesia Preprocedure Evaluation (Signed)
Anesthesia Evaluation  Patient identified by MRN, date of birth, ID band Patient awake    Reviewed: Allergy & Precautions, NPO status , Patient's Chart, lab work & pertinent test results  Airway Mallampati: II  TM Distance: >3 FB Neck ROM: Full    Dental no notable dental hx.    Pulmonary asthma ,    Pulmonary exam normal breath sounds clear to auscultation       Cardiovascular negative cardio ROS Normal cardiovascular exam Rhythm:Regular Rate:Normal     Neuro/Psych Bipolar Disorder negative neurological ROS     GI/Hepatic negative GI ROS, Neg liver ROS,   Endo/Other  obesity  Renal/GU negative Renal ROS  negative genitourinary   Musculoskeletal negative musculoskeletal ROS (+)   Abdominal   Peds negative pediatric ROS (+)  Hematology negative hematology ROS (+)   Anesthesia Other Findings   Reproductive/Obstetrics negative OB ROS                             Anesthesia Physical Anesthesia Plan  ASA: 2  Anesthesia Plan: General   Post-op Pain Management: Minimal or no pain anticipated   Induction: Intravenous  PONV Risk Score and Plan: 3 and Ondansetron, Dexamethasone, Midazolam and Treatment may vary due to age or medical condition  Airway Management Planned: LMA  Additional Equipment:   Intra-op Plan:   Post-operative Plan: Extubation in OR  Informed Consent: I have reviewed the patients History and Physical, chart, labs and discussed the procedure including the risks, benefits and alternatives for the proposed anesthesia with the patient or authorized representative who has indicated his/her understanding and acceptance.     Dental advisory given  Plan Discussed with: CRNA and Surgeon  Anesthesia Plan Comments:         Anesthesia Quick Evaluation

## 2022-07-13 NOTE — Discharge Instructions (Addendum)
No ibuprofen, Advil, Aleve, Motrin, ketorolac, meloxicam, naproxen, or other NSAIDS until after 7:30 pm today if needed.   Post Anesthesia Home Care Instructions  Activity: Get plenty of rest for the remainder of the day. A responsible individual must stay with you for 24 hours following the procedure.  For the next 24 hours, DO NOT: -Drive a car -Paediatric nurse -Drink alcoholic beverages -Take any medication unless instructed by your physician -Make any legal decisions or sign important papers.  Meals: Start with liquid foods such as gelatin or soup. Progress to regular foods as tolerated. Avoid greasy, spicy, heavy foods. If nausea and/or vomiting occur, drink only clear liquids until the nausea and/or vomiting subsides. Call your physician if vomiting continues.  Special Instructions/Symptoms: Your throat may feel dry or sore from the anesthesia or the breathing tube placed in your throat during surgery. If this causes discomfort, gargle with warm salt water. The discomfort should disappear within 24 hours.  If you had a scopolamine patch placed behind your ear for the management of post- operative nausea and/or vomiting:  1. The medication in the patch is effective for 72 hours, after which it should be removed.  Wrap patch in a tissue and discard in the trash. Wash hands thoroughly with soap and water. 2. You may remove the patch earlier than 72 hours if you experience unpleasant side effects which may include dry mouth, dizziness or visual disturbances. 3. Avoid touching the patch. Wash your hands with soap and water after contact with the patch.     Post Anesthesia Home Care Instructions  Activity: Get plenty of rest for the remainder of the day. A responsible individual must stay with you for 24 hours following the procedure.  For the next 24 hours, DO NOT: -Drive a car -Paediatric nurse -Drink alcoholic beverages -Take any medication unless instructed by your  physician -Make any legal decisions or sign important papers.  Meals: Start with liquid foods such as gelatin or soup. Progress to regular foods as tolerated. Avoid greasy, spicy, heavy foods. If nausea and/or vomiting occur, drink only clear liquids until the nausea and/or vomiting subsides. Call your physician if vomiting continues.  Special Instructions/Symptoms: Your throat may feel dry or sore from the anesthesia or the breathing tube placed in your throat during surgery. If this causes discomfort, gargle with warm salt water. The discomfort should disappear within 24 hours.

## 2022-07-13 NOTE — Anesthesia Procedure Notes (Signed)
Procedure Name: LMA Insertion Date/Time: 07/13/2022 1:11 PM  Performed by: Rogers Blocker, CRNAPre-anesthesia Checklist: Patient identified, Emergency Drugs available, Suction available and Patient being monitored Patient Re-evaluated:Patient Re-evaluated prior to induction Oxygen Delivery Method: Circle System Utilized Preoxygenation: Pre-oxygenation with 100% oxygen Induction Type: IV induction Ventilation: Mask ventilation without difficulty LMA: LMA inserted LMA Size: 4.0 Number of attempts: 1 Placement Confirmation: positive ETCO2 Tube secured with: Tape Dental Injury: Teeth and Oropharynx as per pre-operative assessment

## 2022-07-13 NOTE — Op Note (Signed)
Operative Note  Preoperative diagnosis:  1.  5 mm right UVJ stone 2.  Ambiguous genitalia 3.  Horseshoe kidney  Postoperative diagnosis: 1.  5 mm right UVJ stone 2.  Ambiguous genitalia 3.  Horseshoe kidney  Procedure(s): 1.  Cystoscopy with right ureteroscopy, holmium laser lithotripsy and right JJ stent placement 2.  Right retrograde pyelogram with intraoperative interpretation of fluoroscopic imaging  Surgeon: Ellison Hughs, MD  Assistants:  None  Anesthesia:  General  Complications:  None  EBL: 5 mL  Specimens: 1.  Right ureteral stone fragments  Drains/Catheters: 1.  Right 6 French, 24 cm JJ stent without tether  Intraoperative findings:   Ambiguous genitalia with blind-ending vaginal vault Right retrograde pyelogram revealed a filling defect within the distal aspects of the right ureter, consistent with the stone seen on recent cross-sectional imaging 5 mm right distal ureteral calculus  Indication:  Janet Tucker is a 36 y.o. adult with intermittent episodes of right-sided flank pain due to a 5 mm right UVJ stone.  The patient has been consented for the above procedures, voices understanding wishes to proceed.  Description of procedure:  After informed consent was obtained, the patient was brought to the operating room and general LMA anesthesia was administered. The patient was then placed in the dorsolithotomy position and prepped and draped in the usual sterile fashion. A timeout was performed. A 23 French rigid cystoscope was then inserted into the urethral meatus and advanced into the bladder under direct vision. A complete bladder survey revealed no intravesical pathology.  A 5 French ureteral catheter was then inserted into the right ureteral orifice and a retrograde pyelogram was obtained, with the findings listed above.  A Glidewire was then used to intubate the lumen of the ureteral catheter and was advanced up to the right renal pelvis, under  fluoroscopic guidance.  The catheter was then removed, leaving the wire in place.  A semirigid ureteroscope was then advanced into the bladder and up to the distal aspects of the right ureter where the stone was identified.  A 200 m holmium laser was then used to fracture the stone into several smaller pieces.  A 0 tip basket was then used to extract all stone fragments from the lumen of the right ureter.  A 6 French, 24 cm JJ stent was then placed into good position within the right collecting system, confirming placement via fluoroscopy.  The patient's bladder was drained.  The patient tolerated procedure well and was transferred to the postanesthesia in stable condition.  Plan: Follow-up in 1 week for office cystoscopy and stent

## 2022-07-14 ENCOUNTER — Encounter (HOSPITAL_BASED_OUTPATIENT_CLINIC_OR_DEPARTMENT_OTHER): Payer: Self-pay | Admitting: Urology

## 2022-07-19 DIAGNOSIS — Q978 Other specified sex chromosome abnormalities, female phenotype: Secondary | ICD-10-CM | POA: Diagnosis not present

## 2022-07-19 DIAGNOSIS — N201 Calculus of ureter: Secondary | ICD-10-CM | POA: Diagnosis not present

## 2022-07-28 DIAGNOSIS — F410 Panic disorder [episodic paroxysmal anxiety] without agoraphobia: Secondary | ICD-10-CM | POA: Diagnosis not present

## 2022-07-28 DIAGNOSIS — F3181 Bipolar II disorder: Secondary | ICD-10-CM | POA: Diagnosis not present

## 2022-07-28 DIAGNOSIS — F4312 Post-traumatic stress disorder, chronic: Secondary | ICD-10-CM | POA: Diagnosis not present

## 2022-07-28 DIAGNOSIS — F4011 Social phobia, generalized: Secondary | ICD-10-CM | POA: Diagnosis not present

## 2022-08-24 DIAGNOSIS — R1084 Generalized abdominal pain: Secondary | ICD-10-CM | POA: Diagnosis not present

## 2022-08-24 DIAGNOSIS — N201 Calculus of ureter: Secondary | ICD-10-CM | POA: Diagnosis not present

## 2022-08-25 DIAGNOSIS — F3181 Bipolar II disorder: Secondary | ICD-10-CM | POA: Diagnosis not present

## 2022-08-25 DIAGNOSIS — F410 Panic disorder [episodic paroxysmal anxiety] without agoraphobia: Secondary | ICD-10-CM | POA: Diagnosis not present

## 2022-08-25 DIAGNOSIS — F4011 Social phobia, generalized: Secondary | ICD-10-CM | POA: Diagnosis not present

## 2022-08-25 DIAGNOSIS — F4312 Post-traumatic stress disorder, chronic: Secondary | ICD-10-CM | POA: Diagnosis not present

## 2022-09-22 DIAGNOSIS — F4011 Social phobia, generalized: Secondary | ICD-10-CM | POA: Diagnosis not present

## 2022-09-22 DIAGNOSIS — F410 Panic disorder [episodic paroxysmal anxiety] without agoraphobia: Secondary | ICD-10-CM | POA: Diagnosis not present

## 2022-09-22 DIAGNOSIS — F4312 Post-traumatic stress disorder, chronic: Secondary | ICD-10-CM | POA: Diagnosis not present

## 2022-09-22 DIAGNOSIS — F3181 Bipolar II disorder: Secondary | ICD-10-CM | POA: Diagnosis not present

## 2022-10-20 DIAGNOSIS — F902 Attention-deficit hyperactivity disorder, combined type: Secondary | ICD-10-CM | POA: Diagnosis not present

## 2022-11-11 DIAGNOSIS — E559 Vitamin D deficiency, unspecified: Secondary | ICD-10-CM | POA: Diagnosis not present

## 2022-11-11 DIAGNOSIS — E661 Drug-induced obesity: Secondary | ICD-10-CM | POA: Diagnosis not present

## 2022-11-11 DIAGNOSIS — R635 Abnormal weight gain: Secondary | ICD-10-CM | POA: Diagnosis not present

## 2022-11-11 DIAGNOSIS — R5383 Other fatigue: Secondary | ICD-10-CM | POA: Diagnosis not present

## 2022-11-11 DIAGNOSIS — E538 Deficiency of other specified B group vitamins: Secondary | ICD-10-CM | POA: Diagnosis not present

## 2022-11-11 DIAGNOSIS — F3181 Bipolar II disorder: Secondary | ICD-10-CM | POA: Diagnosis not present

## 2022-11-11 DIAGNOSIS — F649 Gender identity disorder, unspecified: Secondary | ICD-10-CM | POA: Diagnosis not present

## 2022-11-11 DIAGNOSIS — E669 Obesity, unspecified: Secondary | ICD-10-CM | POA: Diagnosis not present

## 2022-11-11 DIAGNOSIS — Z87890 Personal history of sex reassignment: Secondary | ICD-10-CM | POA: Diagnosis not present

## 2022-11-11 DIAGNOSIS — Z6838 Body mass index (BMI) 38.0-38.9, adult: Secondary | ICD-10-CM | POA: Diagnosis not present

## 2022-11-17 DIAGNOSIS — F4312 Post-traumatic stress disorder, chronic: Secondary | ICD-10-CM | POA: Diagnosis not present

## 2022-11-17 DIAGNOSIS — F902 Attention-deficit hyperactivity disorder, combined type: Secondary | ICD-10-CM | POA: Diagnosis not present

## 2022-11-17 DIAGNOSIS — F3181 Bipolar II disorder: Secondary | ICD-10-CM | POA: Diagnosis not present

## 2022-11-17 DIAGNOSIS — F410 Panic disorder [episodic paroxysmal anxiety] without agoraphobia: Secondary | ICD-10-CM | POA: Diagnosis not present

## 2022-12-15 DIAGNOSIS — F4312 Post-traumatic stress disorder, chronic: Secondary | ICD-10-CM | POA: Diagnosis not present

## 2022-12-15 DIAGNOSIS — F3181 Bipolar II disorder: Secondary | ICD-10-CM | POA: Diagnosis not present

## 2022-12-15 DIAGNOSIS — F410 Panic disorder [episodic paroxysmal anxiety] without agoraphobia: Secondary | ICD-10-CM | POA: Diagnosis not present

## 2022-12-15 DIAGNOSIS — F902 Attention-deficit hyperactivity disorder, combined type: Secondary | ICD-10-CM | POA: Diagnosis not present

## 2023-01-11 DIAGNOSIS — F3181 Bipolar II disorder: Secondary | ICD-10-CM | POA: Diagnosis not present

## 2023-01-11 DIAGNOSIS — F4011 Social phobia, generalized: Secondary | ICD-10-CM | POA: Diagnosis not present

## 2023-01-11 DIAGNOSIS — F410 Panic disorder [episodic paroxysmal anxiety] without agoraphobia: Secondary | ICD-10-CM | POA: Diagnosis not present

## 2023-01-11 DIAGNOSIS — F902 Attention-deficit hyperactivity disorder, combined type: Secondary | ICD-10-CM | POA: Diagnosis not present

## 2023-02-03 ENCOUNTER — Encounter (HOSPITAL_COMMUNITY): Payer: Self-pay

## 2023-02-03 ENCOUNTER — Emergency Department (HOSPITAL_COMMUNITY)
Admission: EM | Admit: 2023-02-03 | Discharge: 2023-02-03 | Disposition: A | Discharge status: Home / Self Care | Payer: Medicare Other | Attending: Emergency Medicine | Admitting: Emergency Medicine

## 2023-02-03 ENCOUNTER — Emergency Department (HOSPITAL_COMMUNITY): Payer: Medicare Other

## 2023-02-03 ENCOUNTER — Other Ambulatory Visit: Payer: Self-pay

## 2023-02-03 DIAGNOSIS — K3189 Other diseases of stomach and duodenum: Secondary | ICD-10-CM | POA: Diagnosis not present

## 2023-02-03 DIAGNOSIS — E871 Hypo-osmolality and hyponatremia: Secondary | ICD-10-CM | POA: Insufficient documentation

## 2023-02-03 DIAGNOSIS — N3001 Acute cystitis with hematuria: Secondary | ICD-10-CM | POA: Diagnosis not present

## 2023-02-03 DIAGNOSIS — R319 Hematuria, unspecified: Secondary | ICD-10-CM | POA: Diagnosis not present

## 2023-02-03 DIAGNOSIS — R109 Unspecified abdominal pain: Secondary | ICD-10-CM | POA: Diagnosis not present

## 2023-02-03 LAB — URINALYSIS, W/ REFLEX TO CULTURE (INFECTION SUSPECTED)
Bilirubin Urine: NEGATIVE
Glucose, UA: NEGATIVE mg/dL
Ketones, ur: NEGATIVE mg/dL
Nitrite: POSITIVE — AB
Protein, ur: 100 mg/dL — AB
RBC / HPF: >50 RBC/hpf (ref 0–5)
Specific Gravity, Urine: 1.008 (ref 1.005–1.030)
WBC, UA: >50 WBC/hpf (ref 0–5)
pH: 5 (ref 5.0–8.0)

## 2023-02-03 LAB — CBC WITH DIFFERENTIAL/PLATELET
Abs Immature Granulocytes: 0.05 10*3/uL (ref 0.00–0.07)
Basophils Absolute: 0.1 10*3/uL (ref 0.0–0.1)
Basophils Relative: 0 %
Eosinophils Absolute: 0.4 10*3/uL (ref 0.0–0.5)
Eosinophils Relative: 3 %
HCT: 38.8 % (ref 36.0–46.0)
Hemoglobin: 12.8 g/dL (ref 12.0–15.0)
Immature Granulocytes: 0 %
Lymphocytes Relative: 21 %
Lymphs Abs: 2.6 10*3/uL (ref 0.7–4.0)
MCH: 27.8 pg (ref 26.0–34.0)
MCHC: 33 g/dL (ref 30.0–36.0)
MCV: 84.3 fL (ref 80.0–100.0)
Monocytes Absolute: 0.6 10*3/uL (ref 0.1–1.0)
Monocytes Relative: 5 %
Neutro Abs: 9 10*3/uL — ABNORMAL HIGH (ref 1.7–7.7)
Neutrophils Relative %: 71 %
Platelets: 442 10*3/uL — ABNORMAL HIGH (ref 150–400)
RBC: 4.6 MIL/uL (ref 3.87–5.11)
RDW: 13.5 % (ref 11.5–15.5)
WBC: 12.7 10*3/uL — ABNORMAL HIGH (ref 4.0–10.5)
nRBC: 0 % (ref 0.0–0.2)

## 2023-02-03 LAB — COMPREHENSIVE METABOLIC PANEL
ALT: 16 U/L (ref 0–44)
AST: 17 U/L (ref 15–41)
Albumin: 3.9 g/dL (ref 3.5–5.0)
Alkaline Phosphatase: 57 U/L (ref 38–126)
Anion gap: 11 (ref 5–15)
BUN: 10 mg/dL (ref 6–20)
CO2: 22 mmol/L (ref 22–32)
Calcium: 9.2 mg/dL (ref 8.9–10.3)
Chloride: 98 mmol/L (ref 98–111)
Creatinine, Ser: 0.8 mg/dL (ref 0.44–1.00)
GFR, Estimated: >60 mL/min (ref >60)
Glucose, Bld: 102 mg/dL — ABNORMAL HIGH (ref 70–99)
Potassium: 3.7 mmol/L (ref 3.5–5.1)
Sodium: 131 mmol/L — ABNORMAL LOW (ref 135–145)
Total Bilirubin: 0.6 mg/dL (ref 0.3–1.2)
Total Protein: 7.9 g/dL (ref 6.5–8.1)

## 2023-02-03 LAB — I-STAT BETA HCG BLOOD, ED (MC, WL, AP ONLY): I-stat hCG, quantitative: <5 m[IU]/mL (ref <5)

## 2023-02-03 MED ORDER — CEPHALEXIN 500 MG PO CAPS: 500.0000 mg | ORAL_CAPSULE | Freq: Four times a day (QID) | ORAL | 0 refills | Status: AC

## 2023-02-03 MED ORDER — CEPHALEXIN 500 MG PO CAPS
500.0000 mg | ORAL_CAPSULE | Freq: Once | ORAL | Status: AC
  Administered 2023-02-03: 500 mg via ORAL
  Filled 2023-02-03: qty 1

## 2023-02-03 NOTE — ED Triage Notes (Signed)
Patient reports pain and burning with urination, increased frequency, and bil flank pain. Patient reports blood in urine starting today. Patient denies fevers. Patient has hx of kidney stones.

## 2023-02-03 NOTE — Discharge Instructions (Signed)
If you start having high fevers, vomiting, symptoms or not improving with antibiotics you need to go to your doctor or return to the emergency room.  CAT scan today did not show any signs of stones.

## 2023-02-03 NOTE — ED Provider Notes (Signed)
[place] EMERGENCY DEPARTMENT AT [facility] Provider Note   CSN: [id number] Arrival date & time: 02/03/23  1929     History  Chief Complaint  Patient presents with   Hematuria    Janet Tucker is a 37 y.o. adult.  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  She has had chills at home and is having some intermittent discomfort.  No vomiting or diarrhea.  Had a kidney stone in September that required intervention not on problem since  The history is provided by the patient and medical records.  Hematuria       Home Medications Prior to Admission medications   Medication Sig Start Date End Date Taking? Authorizing Provider  cephALEXin (KEFLEX) 500 MG capsule Take 1 capsule (500 mg total) by mouth 4 (four) times daily. 02/03/23  Yes Lux Skilton, Alphonzo Lemmings, MD  albuterol (VENTOLIN HFA) 108 (90 Base) MCG/ACT inhaler Inhale 1-2 puffs into the lungs every 6 (six) hours as needed for wheezing or shortness of breath.    [provider]  ALPRAZolam Prudy Feeler) 1 MG tablet Take 1 mg by mouth at bedtime as needed for anxiety.    [provider]  budesonide-formoterol (SYMBICORT) 160-4.5 MCG/ACT inhaler Inhale 2 puffs into the lungs 2 (two) times daily as needed.    [provider]  estradiol (ESTRACE) 2 MG tablet Take 1/2 tab po qd and 1 tab po qhs Patient taking differently: Take 2 mg by mouth 2 (two) times daily. 08/13/18   Sherren Mocha, MD  FLUoxetine (PROZAC) 20 MG tablet Take 40 mg by mouth daily.    [provider]  gabapentin (NEURONTIN) 300 MG capsule Take 300 mg by mouth at bedtime.    [provider]  HYDROcodone-acetaminophen (NORCO) 5-325 MG tablet Take 1-2 tablets by mouth every 4 (four) hours as needed for severe pain. 06/03/22    Pricilla Loveless, MD  ibuprofen (ADVIL) 600 MG tablet Take 1 tablet (600 mg total) by mouth every 8 (eight) hours as needed. 06/03/22   Pricilla Loveless, MD  lamoTRIgine (LAMICTAL) 150 MG tablet Take 150 mg by mouth at bedtime.    [provider]  Multiple Vitamins-Minerals (MULTIVITAMIN ADULT EXTRA C) CHEW Chew by mouth daily.    [provider]  ondansetron (ZOFRAN-ODT) 4 MG disintegrating tablet Take 1 tablet (4 mg total) by mouth every 8 (eight) hours as needed for nausea or vomiting. 06/03/22   Pricilla Loveless, MD  oxybutynin (DITROPAN) 5 MG tablet Take 1 tablet (5 mg total) by mouth every 8 (eight) hours as needed for bladder spasms. 07/13/22   Rene Paci, MD  phenazopyridine (PYRIDIUM) 200 MG tablet Take 1 tablet (200 mg total) by mouth 3 (three) times daily as needed (for pain with urination). 07/13/22 07/13/23  Rene Paci, MD  topiramate (TOPAMAX) 50 MG tablet Take 50 mg by mouth 2 (two) times daily.    [provider]  zolpidem (AMBIEN) 5 MG tablet Take 5 mg by mouth at bedtime.    [provider]      Allergies    Patient has no known allergies.    Review of Systems   Review of Systems  Genitourinary:  Positive for hematuria.    Physical Exam Updated Vital Signs BP (!) 167/99 (BP Location: Right Arm)  Pulse (!) 107   Temp 98.2 F (36.8 C) (Oral)   Resp 18   Ht 5\' 6"  (1.676 m)   Wt 104.3 kg   SpO2 95%   BMI 37.12 kg/m  Physical Exam Vitals and nursing note reviewed.  Constitutional:      General: She is not in acute distress.    Appearance: She is well-developed.  HENT:     Head: Normocephalic and atraumatic.  Eyes:     Pupils: Pupils are equal, round, and reactive to light.  Cardiovascular:     Rate and Rhythm: Normal rate and regular rhythm.     Heart sounds: Normal heart sounds. No murmur heard.    No friction rub.  Pulmonary:     Effort: Pulmonary effort is normal.     Breath sounds: Normal  breath sounds. No wheezing or rales.  Abdominal:     General: Bowel sounds are normal. There is no distension.     Palpations: Abdomen is soft.     Tenderness: There is abdominal tenderness in the suprapubic area. There is right CVA tenderness and left CVA tenderness. There is no guarding or rebound.  Musculoskeletal:        General: No tenderness. Normal range of motion.     Comments: No edema  Skin:    General: Skin is warm and dry.     Findings: No rash.  Neurological:     Mental Status: She is alert and oriented to person, place, and time.     Cranial Nerves: No cranial nerve deficit.  Psychiatric:        Behavior: Behavior normal.     ED Results / Procedures / Treatments   Labs (all labs ordered are listed, but only abnormal results are displayed) Labs Reviewed  URINALYSIS, W/ REFLEX TO CULTURE (INFECTION SUSPECTED) - Abnormal; Notable for the following components:      Result Value   Color, Urine AMBER (*)    APPearance CLOUDY (*)    Hgb urine dipstick LARGE (*)    Protein, ur 100 (*)    Nitrite POSITIVE (*)    Leukocytes,Ua SMALL (*)    Bacteria, UA FEW (*)    Non Squamous Epithelial 0-5 (*)    All other components within normal limits  CBC WITH DIFFERENTIAL/PLATELET - Abnormal; Notable for the following components:   WBC 12.7 (*)    Platelets 442 (*)    Neutro Abs 9.0 (*)    All other components within normal limits  COMPREHENSIVE METABOLIC PANEL - Abnormal; Notable for the following components:   Sodium 131 (*)    Glucose, Bld 102 (*)    All other components within normal limits  URINE CULTURE  I-STAT BETA HCG BLOOD, ED ([facility], [facility], [facility] ONLY)    EKG None  Radiology CT Renal Stone Study  Result Date: 02/03/2023 CLINICAL DATA:  Flank pain, dysuria, hematuria EXAM: CT ABDOMEN AND PELVIS WITHOUT CONTRAST TECHNIQUE: Multidetector CT imaging of the abdomen and pelvis was performed following the standard protocol without IV contrast. RADIATION DOSE REDUCTION: This  exam was performed according to the departmental dose-optimization program which includes automated exposure control, adjustment of the mA and/or kV according to patient size and/or use of iterative reconstruction technique. COMPARISON:  07/06/2022 FINDINGS: Lower chest: Visualized lower lung fields are clear. Hepatobiliary: No focal abnormalities are seen in liver. Surgical clips are seen in gallbladder fossa. Pancreas: No focal abnormalities are seen. Spleen: Unremarkable. Adrenals/Urinary Tract: Adrenals are unremarkable. There is fusion of lower  poles of both kidneys anterior to the aorta and inferior vena cava suggesting horseshoe kidney. There is no hydronephrosis. There are no renal or ureteral stones. Urinary bladder is unremarkable. Stomach/Bowel: Stomach is unremarkable. Small bowel loops are not dilated. Appendix is not dilated. There are 2 oval structures measuring approximately 2 cm in diameter in ascending colon slightly above the ileocolic junction. These may suggest foreign bodies in the lumen. There is no significant wall thickening in right colon. There is no pericolic stranding. Vascular/Lymphatic: Unremarkable. Reproductive: Uterus is not seen. There is stranding in the subcutaneous plane in suprapubic region suggesting previous intervention such as vaginal reconstruction. Other: There is no ascites or pneumoperitoneum. Small umbilical and paraumbilical hernias containing fat are noted. Musculoskeletal: No acute findings are seen. IMPRESSION: There is no evidence of intestinal obstruction or pneumoperitoneum. Appendix is not dilated. There is no hydronephrosis. Horseshoe kidney. There are 2 oval structures in the lumen of ascending colon slightly above the level of ileocecal junction. This may suggest oral medications or some other foreign bodies in the lumen. Electronically Signed   By: Ernie Avena M.D.   On: 02/03/2023 21:17    Procedures Procedures    Medications Ordered in  ED Medications  cephALEXin (KEFLEX) capsule 500 mg (has no administration in time range)    ED Course/ Medical Decision Making/ A&P                             Medical Decision Making Amount and/or Complexity of Data Reviewed Labs: ordered. Decision-making details documented in ED Course. Radiology: ordered and independent interpretation performed. Decision-making details documented in ED Course.  Risk Prescription drug management.   Pt with multiple medical problems and comorbidities and presenting today with a complaint that caries a high risk for morbidity and mortality.  Today with dysuria and hematuria.  Concern for UTI, Pyelo, or infected kidney stone.  VS with mild tachycardia and HTN.  Labs pending.  9:31 PM I independently interpreted patient's labs and CBC with leukocytosis of 12.7, normal hemoglobin, CMP with mild hyponatremia of 131 and UA concerning for infection with small leukocytes, positive nitrites, greater than 50 red and white cells and few bacteria.  I have independently visualized and interpreted pt's images today.  CT today without evidence of hydronephrosis or renal stone.  Radiology reports horseshoe kidney but no other acute findings.  Findings discussed with the patient.  At this time a culture was sent.  Patient will be treated for UTI.  She was given Keflex and a prescription sent to her pharmacy.  She is comfortable with this plan.  No indication for admission or further testing at this time.          Final Clinical Impression(s) / ED Diagnoses Final diagnoses:  Acute cystitis with hematuria    Rx / DC Orders ED Discharge Orders          Ordered    cephALEXin (KEFLEX) 500 MG capsule  4 times daily        02/03/23 2131              Gwyneth Sprout, MD 02/03/23 2131

## 2023-02-05 LAB — URINE CULTURE: Culture: 10000 — AB

## 2023-02-06 ENCOUNTER — Telehealth (HOSPITAL_BASED_OUTPATIENT_CLINIC_OR_DEPARTMENT_OTHER): Payer: Self-pay | Admitting: *Deleted

## 2023-02-06 NOTE — Telephone Encounter (Signed)
Post ED Visit - Positive Culture Follow-up  Culture report reviewed by antimicrobial stewardship pharmacist: [facility] []  Enzo Bi, Pharm.D. []  Celedonio Miyamoto, Pharm.D., BCPS AQ-ID []  Garvin Fila, Pharm.D., BCPS []  Georgina Pillion, Pharm.D., BCPS []  [place], [place].D., BCPS, AAHIVP []  Estella Husk, Pharm.D., BCPS, AAHIVP []  Lysle Pearl, PharmD, BCPS []  Phillips Climes, PharmD, BCPS []  Agapito Games, PharmD, BCPS []  Verlan Friends, PharmD []  Mervyn Gay, PharmD, BCPS []  Vinnie Level, PharmD  [facility] []  Len Childs, PharmD []  Greer Pickerel, PharmD []  Adalberto Cole, PharmD []  Perlie Gold, Rph []  Lonell Face) Jean Rosenthal, PharmD []  Earl Many, PharmD []  Junita Push, PharmD []  Dorna Leitz, PharmD []  Terrilee Files, PharmD []  Lynann Beaver, PharmD []  Keturah Barre, PharmD []  Loralee Pacas, PharmD [x]  Bernadene Person, PharmD   Positive urine culture Treated with Cephalexin, organism sensitive to the same and no further patient follow-up is required at this time.  Virl Axe [facility] 02/06/2023, 2:04 PM

## 2023-02-13 DIAGNOSIS — F640 Transsexualism: Secondary | ICD-10-CM | POA: Diagnosis not present

## 2023-02-13 DIAGNOSIS — Z87890 Personal history of sex reassignment: Secondary | ICD-10-CM | POA: Diagnosis not present

## 2023-02-13 DIAGNOSIS — R300 Dysuria: Secondary | ICD-10-CM | POA: Diagnosis not present

## 2023-02-13 DIAGNOSIS — F649 Gender identity disorder, unspecified: Secondary | ICD-10-CM | POA: Diagnosis not present

## 2023-02-23 DIAGNOSIS — F4312 Post-traumatic stress disorder, chronic: Secondary | ICD-10-CM | POA: Diagnosis not present

## 2023-02-23 DIAGNOSIS — F3181 Bipolar II disorder: Secondary | ICD-10-CM | POA: Diagnosis not present

## 2023-02-23 DIAGNOSIS — F902 Attention-deficit hyperactivity disorder, combined type: Secondary | ICD-10-CM | POA: Diagnosis not present

## 2023-02-23 DIAGNOSIS — F410 Panic disorder [episodic paroxysmal anxiety] without agoraphobia: Secondary | ICD-10-CM | POA: Diagnosis not present

## 2023-03-22 DIAGNOSIS — F3181 Bipolar II disorder: Secondary | ICD-10-CM | POA: Diagnosis not present

## 2023-03-22 DIAGNOSIS — F902 Attention-deficit hyperactivity disorder, combined type: Secondary | ICD-10-CM | POA: Diagnosis not present

## 2023-03-22 DIAGNOSIS — F410 Panic disorder [episodic paroxysmal anxiety] without agoraphobia: Secondary | ICD-10-CM | POA: Diagnosis not present

## 2023-03-22 DIAGNOSIS — F4312 Post-traumatic stress disorder, chronic: Secondary | ICD-10-CM | POA: Diagnosis not present

## 2023-04-19 DIAGNOSIS — F902 Attention-deficit hyperactivity disorder, combined type: Secondary | ICD-10-CM | POA: Diagnosis not present

## 2023-04-19 DIAGNOSIS — F410 Panic disorder [episodic paroxysmal anxiety] without agoraphobia: Secondary | ICD-10-CM | POA: Diagnosis not present

## 2023-04-19 DIAGNOSIS — F4011 Social phobia, generalized: Secondary | ICD-10-CM | POA: Diagnosis not present

## 2023-04-19 DIAGNOSIS — F3181 Bipolar II disorder: Secondary | ICD-10-CM | POA: Diagnosis not present

## 2023-05-05 ENCOUNTER — Encounter (INDEPENDENT_AMBULATORY_CARE_PROVIDER_SITE_OTHER): Payer: Self-pay

## 2023-05-05 ENCOUNTER — Other Ambulatory Visit: Payer: Self-pay

## 2023-05-05 ENCOUNTER — Emergency Department (HOSPITAL_COMMUNITY): Payer: Medicare Other

## 2023-05-05 ENCOUNTER — Emergency Department (HOSPITAL_COMMUNITY)
Admission: EM | Admit: 2023-05-05 | Discharge: 2023-05-05 | Disposition: A | Discharge status: Home / Self Care | Payer: Medicare Other | Source: Home / Self Care | Attending: Emergency Medicine | Admitting: Emergency Medicine

## 2023-05-05 ENCOUNTER — Encounter (HOSPITAL_COMMUNITY): Payer: Self-pay

## 2023-05-05 DIAGNOSIS — R0602 Shortness of breath: Secondary | ICD-10-CM | POA: Diagnosis not present

## 2023-05-05 DIAGNOSIS — J45909 Unspecified asthma, uncomplicated: Secondary | ICD-10-CM | POA: Insufficient documentation

## 2023-05-05 DIAGNOSIS — R0789 Other chest pain: Secondary | ICD-10-CM | POA: Diagnosis not present

## 2023-05-05 LAB — CBC
HCT: 40.2 % (ref 36.0–46.0)
Hemoglobin: 13.2 g/dL (ref 12.0–15.0)
MCH: 27.3 pg (ref 26.0–34.0)
MCHC: 32.8 g/dL (ref 30.0–36.0)
MCV: 83.1 fL (ref 80.0–100.0)
Platelets: 472 10*3/uL — ABNORMAL HIGH (ref 150–400)
RBC: 4.84 MIL/uL (ref 3.87–5.11)
RDW: 13.2 % (ref 11.5–15.5)
WBC: 9.3 10*3/uL (ref 4.0–10.5)
nRBC: 0 % (ref 0.0–0.2)

## 2023-05-05 LAB — BASIC METABOLIC PANEL
Anion gap: 11 (ref 5–15)
BUN: 9 mg/dL (ref 6–20)
CO2: 21 mmol/L — ABNORMAL LOW (ref 22–32)
Calcium: 9.3 mg/dL (ref 8.9–10.3)
Chloride: 101 mmol/L (ref 98–111)
Creatinine, Ser: 0.66 mg/dL (ref 0.44–1.00)
GFR, Estimated: >60 mL/min (ref >60)
Glucose, Bld: 91 mg/dL (ref 70–99)
Potassium: 3.3 mmol/L — ABNORMAL LOW (ref 3.5–5.1)
Sodium: 133 mmol/L — ABNORMAL LOW (ref 135–145)

## 2023-05-05 LAB — HCG, SERUM, QUALITATIVE: Preg, Serum: NEGATIVE

## 2023-05-05 LAB — TROPONIN I (HIGH SENSITIVITY): Troponin I (High Sensitivity): <2 ng/L (ref <18)

## 2023-05-05 NOTE — Discharge Instructions (Addendum)
Your workup here was reassuring. We did not see any abnormalities on your chest x-ray or EKG. Your labs are reassuring.  Your potassium was slightly low at 3.3, please increase your intake of potassium rich foods.  Follow-up with your PCP within the next week to further discuss your symptoms if they have not improved.  Return the ER if you have worsening shortness of breath, severe chest pain, dizziness, left arm pain, any other new or concerning symptoms

## 2023-05-05 NOTE — ED Triage Notes (Signed)
SOB when laying flat in bed for 2-3 days. New mid sternal chest pressure that started today when sitting at computer. Pt states she felt like she was going to pass out.  Hx of asthma

## 2023-05-05 NOTE — ED Provider Notes (Signed)
[place] EMERGENCY DEPARTMENT AT [facility] Provider Note   CSN: [id number] Arrival date & time: 05/05/23  1406     History  Chief Complaint  Patient presents with   Chest Pain   Shortness of Breath    Janet Tucker is a 37 y.o. adult with history of asthma, generalized anxiety disorder, who presents with concern for episodes of substernal chest tightness and shortness of breath.  She states this started yesterday in the episodes will last a couple hours each before subsiding.  They will come on at rest.  It is accompanied with her visual field narrowing and feeling somewhat faint and then subside.  She originally thought it was her asthma and tried her albuterol inhaler which did not not help.  Nothing seems to make the pain better or worse.  It is not exertional, nonpositional, not associated with eating.  She denies any chest pain, arm pain, nausea or vomiting, abdominal pain.  Denies any fevers chills or cough.  She denies any previous cardiac history.  No history of hypertension or diabetes.  No family history of early cardiac deaths.   Chest Pain Associated symptoms: shortness of breath   Shortness of Breath Associated symptoms: chest pain        Home Medications Prior to Admission medications   Medication Sig Start Date End Date Taking? Authorizing Provider  albuterol (VENTOLIN HFA) 108 (90 Base) MCG/ACT inhaler Inhale 1-2 puffs into the lungs every 6 (six) hours as needed for wheezing or shortness of breath.    [provider]  ALPRAZolam Prudy Feeler) 1 MG tablet Take 1 mg by mouth at bedtime as needed for anxiety.    [provider]  budesonide-formoterol (SYMBICORT) 160-4.5 MCG/ACT inhaler Inhale 2 puffs into the lungs 2 (two) times daily as needed.    [provider]  cephALEXin (KEFLEX) 500 MG capsule Take 1 capsule (500 mg total) by mouth 4 (four) times daily. 02/03/23   Gwyneth Sprout, MD  estradiol (ESTRACE) 2 MG tablet Take  1/2 tab po qd and 1 tab po qhs Patient taking differently: Take 2 mg by mouth 2 (two) times daily. 08/13/18   Sherren Mocha, MD  FLUoxetine (PROZAC) 20 MG tablet Take 40 mg by mouth daily.    [provider]  gabapentin (NEURONTIN) 300 MG capsule Take 300 mg by mouth at bedtime.    [provider]  HYDROcodone-acetaminophen (NORCO) 5-325 MG tablet Take 1-2 tablets by mouth every 4 (four) hours as needed for severe pain. 06/03/22   Pricilla Loveless, MD  ibuprofen (ADVIL) 600 MG tablet Take 1 tablet (600 mg total) by mouth every 8 (eight) hours as needed. 06/03/22   Pricilla Loveless, MD  lamoTRIgine (LAMICTAL) 150 MG tablet Take 150 mg by mouth at bedtime.    [provider]  Multiple Vitamins-Minerals (MULTIVITAMIN ADULT EXTRA C) CHEW Chew by mouth daily.    [provider]  ondansetron (ZOFRAN-ODT) 4 MG disintegrating tablet Take 1 tablet (4 mg total) by mouth every 8 (eight) hours as needed for nausea or vomiting. 06/03/22   Pricilla Loveless, MD  oxybutynin (DITROPAN) 5 MG tablet Take 1 tablet (5 mg total) by mouth every 8 (eight) hours as needed for bladder spasms. 07/13/22   Rene Paci, MD  phenazopyridine (PYRIDIUM) 200 MG tablet Take 1 tablet (200 mg total) by mouth 3 (three) times daily as needed (for pain with urination). 07/13/22 07/13/23  Rene Paci, MD  topiramate (TOPAMAX) 50 MG tablet Take  50 mg by mouth 2 (two) times daily.    [provider]  zolpidem (AMBIEN) 5 MG tablet Take 5 mg by mouth at bedtime.    [provider]      Allergies    Patient has no known allergies.    Review of Systems   Review of Systems  Respiratory:  Positive for shortness of breath.   Cardiovascular:  Positive for chest pain.    Physical Exam Updated Vital Signs BP (!) 129/95   Pulse 73   Temp 98.3 F (36.8 C) (Oral)   Resp 16   Ht 5\' 6"  (1.676 m)   Wt 111.1 kg   SpO2 100%   BMI 39.54 kg/m  Physical Exam Vitals and  nursing note reviewed.  Constitutional:      General: She is not in acute distress.    Appearance: She is well-developed.  HENT:     Head: Normocephalic and atraumatic.  Eyes:     Extraocular Movements: Extraocular movements intact.     Conjunctiva/sclera: Conjunctivae normal.     Pupils: Pupils are equal, round, and reactive to light.  Cardiovascular:     Rate and Rhythm: Normal rate and regular rhythm.     Heart sounds: No murmur heard. Pulmonary:     Effort: Pulmonary effort is normal. No respiratory distress.     Breath sounds: Normal breath sounds.  Abdominal:     Palpations: Abdomen is soft.     Tenderness: There is no abdominal tenderness.  Musculoskeletal:        General: No swelling.     Cervical back: Neck supple.  Skin:    General: Skin is warm and dry.     Capillary Refill: Capillary refill takes less than 2 seconds.  Neurological:     General: No focal deficit present.     Mental Status: She is alert.     Cranial Nerves: No cranial nerve deficit.  Psychiatric:        Mood and Affect: Mood normal.     ED Results / Procedures / Treatments   Labs (all labs ordered are listed, but only abnormal results are displayed) Labs Reviewed  BASIC METABOLIC PANEL - Abnormal; Notable for the following components:      Result Value   Sodium 133 (*)    Potassium 3.3 (*)    CO2 21 (*)    All other components within normal limits  CBC - Abnormal; Notable for the following components:   Platelets 472 (*)    All other components within normal limits  HCG, SERUM, QUALITATIVE  TROPONIN I (HIGH SENSITIVITY)    EKG EKG Interpretation Date/Time:  Friday May 05 2023 14:20:41 EDT Ventricular Rate:  84 PR Interval:  140 QRS Duration:  86 QT Interval:  402 QTC Calculation: 475 R Axis:   46  Text Interpretation: Normal sinus rhythm Nonspecific T wave abnormality Prolonged QT Abnormal ECG When compared with ECG of 21-Oct-2020 21:58, PREVIOUS ECG IS PRESENT Confirmed by  Kristine Royal [id number]) on 05/05/2023 6:38:44 PM  Radiology DG Chest 2 View  Result Date: 05/05/2023 CLINICAL DATA:  chest pressure.  Shortness of breath. EXAM: CHEST - 2 VIEW COMPARISON:  03/06/2020. FINDINGS: Bilateral lung fields are clear. Bilateral costophrenic angles are clear. Normal cardio-mediastinal silhouette. No acute osseous abnormalities. The soft tissues are within normal limits. IMPRESSION: No active cardiopulmonary disease. Electronically Signed   By: Jules Schick M.D.   On: 05/05/2023 15:58    Procedures Procedures  Medications Ordered in ED Medications - No data to display  ED Course/ Medical Decision Making/ A&P             HEART Score: 1                    Medical Decision Making Amount and/or Complexity of Data Reviewed Labs: ordered. Radiology: ordered.   37 y.o. adult with history of asthma, generalized anxiety disorder, presents to the ED for concern of episodes of chest tightness and shortness of breath  Differential diagnosis includes but is not limited to ACS, pneumothorax, pneumonia, COVID, anxiety  ED Course:  Patient presents to the ER with concern for episodes of shortness of breath and substernal chest tightness that occur a couple hours at a time.  These come on randomly, not associated with exertion, position, eating.  It has happened at rest where she has also had some narrowing of her visual field.  On physical exam, she is well-appearing and in no acute distress.  Her vital signs are within normal limits. Cranial nerves III through XII intact.   She has a regular rate and rhythm on cardiac auscultation.  No adventitious sounds on lung exam.  Her EKG shows normal sinus rhythm, chest x-ray with no acute abnormalities.  Troponin undetectable. HEART score of 1 due to obesity. Do not feel this is cardiac related at this time.  She does not have any fever, chills, cough, no abnormalities on chest x-ray, low suspicion for pneumonia or other lung  etiology at this time.  Discussed this with Dr. Rodena Medin who does not feel like second troponin is needed and agrees that patient is able to go home at this time.  Patient offered cardiology referral, however, she declines this at this time.  I discussed with her she needs to follow-up with her PCP for recheck of symptoms in 1 week.    Impression: Shortness of breath atypical chest pain  Disposition:  The patient was discharged home with instructions to follow-up with her PCP in 1 week. Return precautions given.  Lab Tests: I Ordered, and personally interpreted labs.  The pertinent results include:   Troponin undetectable CBC with no leukocytosis BMP unremarkable, slightly low K at 3.3  Imaging Studies ordered: I ordered imaging studies including chest x-ray I independently visualized the imaging with scope of interpretation limited to determining acute life threatening conditions related to emergency care. Imaging showed no consolidations, pneumothorax, pleural effusions I agree with the radiologist interpretation   Cardiac Monitoring: / EKG: The patient was maintained on a cardiac monitor.  I personally viewed and interpreted the cardiac monitored which showed an underlying rhythm of: Normal Sinus rhythm  Co morbidities that complicate the patient evaluation  Asthma, anxiety              Final Clinical Impression(s) / ED Diagnoses Final diagnoses:  Shortness of breath  Atypical chest pain    Rx / DC Orders ED Discharge Orders     None         Arabella Merles, Cordelia Poche 05/05/23 2012    Wynetta Fines, MD 05/05/23 2326

## 2023-05-18 DIAGNOSIS — Z0000 Encounter for general adult medical examination without abnormal findings: Secondary | ICD-10-CM | POA: Diagnosis not present

## 2023-05-18 DIAGNOSIS — F4312 Post-traumatic stress disorder, chronic: Secondary | ICD-10-CM | POA: Diagnosis not present

## 2023-05-18 DIAGNOSIS — Z6839 Body mass index (BMI) 39.0-39.9, adult: Secondary | ICD-10-CM | POA: Diagnosis not present

## 2023-05-18 DIAGNOSIS — E280 Estrogen excess: Secondary | ICD-10-CM | POA: Diagnosis not present

## 2023-05-18 DIAGNOSIS — F410 Panic disorder [episodic paroxysmal anxiety] without agoraphobia: Secondary | ICD-10-CM | POA: Diagnosis not present

## 2023-05-18 DIAGNOSIS — F902 Attention-deficit hyperactivity disorder, combined type: Secondary | ICD-10-CM | POA: Diagnosis not present

## 2023-05-18 DIAGNOSIS — R0602 Shortness of breath: Secondary | ICD-10-CM | POA: Diagnosis not present

## 2023-05-18 DIAGNOSIS — R0789 Other chest pain: Secondary | ICD-10-CM | POA: Diagnosis not present

## 2023-05-18 DIAGNOSIS — F3181 Bipolar II disorder: Secondary | ICD-10-CM | POA: Diagnosis not present

## 2023-05-25 DIAGNOSIS — R0602 Shortness of breath: Secondary | ICD-10-CM | POA: Diagnosis not present

## 2023-06-27 DIAGNOSIS — F4312 Post-traumatic stress disorder, chronic: Secondary | ICD-10-CM | POA: Diagnosis not present

## 2023-06-27 DIAGNOSIS — F3181 Bipolar II disorder: Secondary | ICD-10-CM | POA: Diagnosis not present

## 2023-06-27 DIAGNOSIS — F410 Panic disorder [episodic paroxysmal anxiety] without agoraphobia: Secondary | ICD-10-CM | POA: Diagnosis not present

## 2023-06-27 DIAGNOSIS — F902 Attention-deficit hyperactivity disorder, combined type: Secondary | ICD-10-CM | POA: Diagnosis not present

## 2023-06-30 DIAGNOSIS — R55 Syncope and collapse: Secondary | ICD-10-CM | POA: Diagnosis not present

## 2023-06-30 DIAGNOSIS — Z130 Encounter for screening for diseases of the blood and blood-forming organs and certain disorders involving the immune mechanism: Secondary | ICD-10-CM | POA: Diagnosis not present

## 2023-06-30 DIAGNOSIS — Z6839 Body mass index (BMI) 39.0-39.9, adult: Secondary | ICD-10-CM | POA: Diagnosis not present

## 2023-06-30 DIAGNOSIS — Z13228 Encounter for screening for other metabolic disorders: Secondary | ICD-10-CM | POA: Diagnosis not present

## 2023-07-17 ENCOUNTER — Ambulatory Visit
Admission: EM | Admit: 2023-07-17 | Discharge: 2023-07-17 | Disposition: A | Discharge status: Home / Self Care | Payer: Medicare Other | Attending: Internal Medicine | Admitting: Internal Medicine

## 2023-07-17 DIAGNOSIS — J329 Chronic sinusitis, unspecified: Secondary | ICD-10-CM

## 2023-07-17 DIAGNOSIS — J40 Bronchitis, not specified as acute or chronic: Secondary | ICD-10-CM | POA: Diagnosis not present

## 2023-07-17 DIAGNOSIS — J4530 Mild persistent asthma, uncomplicated: Secondary | ICD-10-CM | POA: Diagnosis not present

## 2023-07-17 MED ORDER — PROMETHAZINE-DM 6.25-15 MG/5ML PO SYRP: 5.0000 mL | ORAL_SOLUTION | Freq: Three times a day (TID) | ORAL | 0 refills | Status: AC | PRN

## 2023-07-17 MED ORDER — AZITHROMYCIN 250 MG PO TABS: ORAL_TABLET | ORAL | 0 refills | Status: AC

## 2023-07-17 MED ORDER — PREDNISONE 20 MG PO TABS
ORAL_TABLET | ORAL | 0 refills | Status: AC
Start: 2023-07-17 — End: ?

## 2023-07-17 NOTE — Discharge Instructions (Signed)
I am managing you for sinobronchitis with azithromycin and a steroid, prednisone. Schedule your albuterol inhaler every 4-6 hours. Use Tylenol, push fluids, get rest. Use cough syrup as needed.

## 2023-07-17 NOTE — ED Triage Notes (Signed)
Pt c/o prod cough, sweats x 1 week-taking OTC meds with no relief-NAD-steady gait
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
## 2023-07-24 DIAGNOSIS — F3181 Bipolar II disorder: Secondary | ICD-10-CM | POA: Diagnosis not present

## 2023-07-24 DIAGNOSIS — F4312 Post-traumatic stress disorder, chronic: Secondary | ICD-10-CM | POA: Diagnosis not present

## 2023-07-24 DIAGNOSIS — F410 Panic disorder [episodic paroxysmal anxiety] without agoraphobia: Secondary | ICD-10-CM | POA: Diagnosis not present

## 2023-07-24 DIAGNOSIS — F902 Attention-deficit hyperactivity disorder, combined type: Secondary | ICD-10-CM | POA: Diagnosis not present

## 2023-08-14 DIAGNOSIS — F902 Attention-deficit hyperactivity disorder, combined type: Secondary | ICD-10-CM | POA: Diagnosis not present

## 2023-08-14 DIAGNOSIS — F4312 Post-traumatic stress disorder, chronic: Secondary | ICD-10-CM | POA: Diagnosis not present

## 2023-08-14 DIAGNOSIS — F3181 Bipolar II disorder: Secondary | ICD-10-CM | POA: Diagnosis not present

## 2023-08-14 DIAGNOSIS — F410 Panic disorder [episodic paroxysmal anxiety] without agoraphobia: Secondary | ICD-10-CM | POA: Diagnosis not present
